# Patient Record
Sex: Male | Born: 1970 | Race: White | Hispanic: No | Marital: Married | Smoking: Current every day smoker
Health system: Southern US, Community
[De-identification: ages and names within clinical notes are randomized; demographics above are authoritative.]

---

## 2012-07-14 ENCOUNTER — Emergency Department
Admission: EM | Admit: 2012-07-14 | Discharge: 2012-07-14 | Disposition: A | Discharge status: Home / Self Care | Payer: BC Managed Care – PPO | Source: Home / Self Care | Attending: Family Medicine | Admitting: Family Medicine

## 2012-07-14 ENCOUNTER — Encounter: Payer: Self-pay | Admitting: *Deleted

## 2012-07-14 ENCOUNTER — Emergency Department (INDEPENDENT_AMBULATORY_CARE_PROVIDER_SITE_OTHER): Payer: BC Managed Care – PPO

## 2012-07-14 DIAGNOSIS — R319 Hematuria, unspecified: Secondary | ICD-10-CM

## 2012-07-14 DIAGNOSIS — R6883 Chills (without fever): Secondary | ICD-10-CM

## 2012-07-14 DIAGNOSIS — R109 Unspecified abdominal pain: Secondary | ICD-10-CM

## 2012-07-14 DIAGNOSIS — R197 Diarrhea, unspecified: Secondary | ICD-10-CM

## 2012-07-14 DIAGNOSIS — R112 Nausea with vomiting, unspecified: Secondary | ICD-10-CM

## 2012-07-14 LAB — POCT CBC W AUTO DIFF (K'VILLE URGENT CARE)

## 2012-07-14 LAB — POCT URINALYSIS DIP (MANUAL ENTRY)
Leukocytes, UA: NEGATIVE
Nitrite, UA: NEGATIVE
Protein Ur, POC: 30
pH, UA: 6.5 (ref 5–8)

## 2012-07-14 MED ORDER — ONDANSETRON HCL 4 MG PO TABS: 4.0000 mg | ORAL_TABLET | Freq: Four times a day (QID) | ORAL | Status: DC

## 2012-07-14 MED ORDER — ONDANSETRON HCL 4 MG/2ML IJ SOLN
4.0000 mg | Freq: Once | INTRAMUSCULAR | Status: AC
  Administered 2012-07-14: 4 mg via INTRAMUSCULAR

## 2012-07-14 MED ORDER — KETOROLAC TROMETHAMINE 60 MG/2ML IM SOLN
60.0000 mg | Freq: Once | INTRAMUSCULAR | Status: AC
  Administered 2012-07-14: 60 mg via INTRAMUSCULAR

## 2012-07-14 MED ORDER — ONDANSETRON 4 MG PO TBDP
4.0000 mg | ORAL_TABLET | Freq: Once | ORAL | Status: AC
  Administered 2012-07-14: 4 mg via ORAL

## 2012-07-14 MED ORDER — IOHEXOL 300 MG/ML  SOLN
100.0000 mL | Freq: Once | INTRAMUSCULAR | Status: AC | PRN
  Administered 2012-07-14: 100 mL via INTRAVENOUS

## 2012-07-14 NOTE — ED Notes (Signed)
Patient c/o abdomen pain, back pain, nausea, diarrhea since last night 11:30p. Patient also c/o painful urination and blood in urine noticed this morning. Has tried Tylenol for the pain with no relief.

## 2012-07-14 NOTE — ED Provider Notes (Signed)
History     CSN: [id number]  Arrival date & time 07/14/12  1136   First MD Initiated Contact with Patient 07/14/12 1250      Chief Complaint  Patient presents with  . Nausea  . Diarrhea  . Dysuria      HPI Comments: The patient complains of awakening at midnight yesterday with mild abdominal pain radiating to his back.  During the night he developed nausea (but no vomiting) and diarrhea.  At 8AM today he noticed mild dysuria and urine darker than normal.  He has had chills/sweats this morning.  No respiratory symptoms.  He states that he has a long history of mild hematuria and has had a work-up for this in the past.   He notes that his wife's two young nieces have been visiting, both of whom have similar GI symptoms.  Denies recent foreign travel, or drinking untreated water in a wilderness environment.   Patient is a 41 y.o. male presenting with abdominal pain. The history is provided by the patient.  Abdominal Pain The primary symptoms of the illness include abdominal pain, fatigue, shortness of breath, nausea, diarrhea, hematemesis and dysuria. The primary symptoms of the illness do not include fever, vomiting or hematochezia. The current episode started 6 to 12 hours ago. The onset of the illness was sudden. The problem has not changed since onset. The hematemesis is not associated with heartburn.  The dysuria is not associated with hematuria, frequency or urgency.  The illness is associated with awakening from sleep. The patient has not had a change in bowel habit. Additional symptoms associated with the illness include chills, anorexia and back pain. Symptoms associated with the illness do not include diaphoresis, heartburn, constipation, urgency, hematuria or frequency. Significant associated medical issues do not include PUD, GERD, inflammatory bowel disease, diabetes, gallstones, liver disease, substance abuse or diverticulitis.    History reviewed. No pertinent past medical  history.  History reviewed. No pertinent past surgical history.  Family History  Problem Relation Age of Onset  . Lupus Father     History  Substance Use Topics  . Smoking status: Former Games developer  . Smokeless tobacco: Never Used  . Alcohol Use: No      Review of Systems  Constitutional: Positive for chills and fatigue. Negative for fever and diaphoresis.  Respiratory: Positive for shortness of breath.   Gastrointestinal: Positive for nausea, abdominal pain, diarrhea, anorexia and hematemesis. Negative for heartburn, vomiting, constipation and hematochezia.  Genitourinary: Positive for dysuria. Negative for urgency, frequency and hematuria.  Musculoskeletal: Positive for back pain.    Allergies  Pollen extract  Home Medications   Current Outpatient Rx  Name  Route  Sig  Dispense  Refill  . ONDANSETRON HCL 4 MG PO TABS   Oral   Take 1 tablet (4 mg total) by mouth every 6 (six) hours. as needed for nausea   12 tablet   0     BP 106/82  Pulse 119  Temp 98.7 F (37.1 C) (Oral)  Resp 24  Ht 5\' 9"  (1.753 m)  Wt 182 lb 8 oz (82.781 kg)  BMI 26.95 kg/m2  SpO2 100%  Physical Exam Nursing notes and Vital Signs reviewed. Appearance:  Patient appears healthy, stated age, and in no acute distress Eyes:  Pupils are equal, round, and reactive to light and accomodation.  Extraocular movement is intact.  Conjunctivae are not inflamed  Ears:  Canals normal.  Tympanic membranes normal.  Nose:   Normal turbinates.  No sinus tenderness.   Mouth/Pharynx:  Normal; moist mucous membranes  Neck:  Supple.  No adenopathy Lungs:  Clear to auscultation.  Breath sounds are equal.  Heart:  Regular rate and rhythm without murmurs, rubs, or gallops.  Abdomen:  There is mild peri-umbilical tenderness without rebound present.  No masses or hepatosplenomegaly.  Bowel sounds are present and increased.  No CVA or flank tenderness.  Extremities:  No edema.  No calf tenderness Skin:  No rash  present.   ED Course  Procedures  none   Labs Reviewed  POCT URINALYSIS DIP (MANUAL ENTRY) small BIL, Trace KET, Small BLO, PRO 30 mg/dL, otherwise negative  POCT CBC W AUTO DIFF ([facility])  WBC 16.7; LY 5.4; MO 5.1; GR 89.5; Hgb 17.0; Platelets 304    Ct Abdomen Pelvis W Contrast  07/14/2012  *RADIOLOGY REPORT*  Clinical Data: A generalized abdominal pain, diarrhea, chills and hematuria.  CT ABDOMEN AND PELVIS WITH CONTRAST  Technique:  Multidetector CT imaging of the abdomen and pelvis was performed following the standard protocol during bolus administration of intravenous contrast.  Contrast: OMNIPAQUE IOHEXOL 300 MG/ML  SOLN  Comparison: None.  Findings: The lung bases are clear.  No pleural effusion or pulmonary nodule.  The heart is normal in size.  No pericardial effusion.  Mild distal esophageal wall thickening could be due to reflux esophagitis.  No mass.  There is diffuse fatty infiltration of the liver but no focal hepatic lesions or intrahepatic biliary dilatation.  The portal and hepatic veins are patent.  The gallbladder is normal.  No common bile duct dilatation.  The pancreas is normal.  The spleen is normal.  The adrenal glands and kidneys are normal.  No renal calculi or hydronephrosis.  No obstructing ureteral calculi.  The bladder appears normal.  The stomach is markedly distended with fluid, contrast and air. The duodenum, small bowel and colon are unremarkable.  No inflammatory changes, mass lesions or obstruction.  The terminal ileum is normal.  The appendix is normal.  The aorta is normal in caliber.  The major branch vessels are patent.  No mesenteric or retroperitoneal mass or lymphadenopathy. A small scattered lymph nodes are noted.  The bladder, prostate gland and seminal vesicles are unremarkable. No pelvic mass, adenopathy or free pelvic fluid collections.  No inguinal mass or hernia.  The bony structures are unremarkable.  IMPRESSION:  1.  No acute  abdominal/pelvic findings, mass lesions or adenopathy. 2.  Fairly marked and diffuse fatty infiltration of the liver. 3.  Distal esophageal wall thickening could be due to reflux esophagitis.  Recommend clinical correlation with any symptoms of dysphagia. 4.  Moderate distention of the stomach.   Original Report Authenticated By: Rudie Meyer, M.D.      1. Nausea & vomiting; despite a leukocytosis of 16.7 suspect viral gastroenteriotis   2. Diarrhea       MDM  Given Zofran 4mg  po. Rx for Zofran. Begin Pedialyte until diarrhea resolved, then switch to regular clear liquids for about 12 to 18 hours.  Advance to a BRAT as tolerated, then gradually advance to a regular diet as tolerated. To decrease diarrhea, mix one heaping tablespoon Citrucel (methylcellulose) in 8 oz water and drink one to three times daily.  When stools become more formed, may take Imodium (loperamide) once or twice daily to decrease stool frequency.  Return for increasing pain, fever, vomiting, etc If symptoms become significantly worse during the night or over the weekend, proceed  to the local emergency room.  Discussed red flags requiring follow-up  Followup with Family Doctor if not improved in about 5 days.  Followup also for finding of fatty liver.        Lattie Haw, MD 07/17/12 [phone]

## 2013-09-24 ENCOUNTER — Encounter: Payer: Self-pay | Admitting: Emergency Medicine

## 2013-09-24 ENCOUNTER — Emergency Department (INDEPENDENT_AMBULATORY_CARE_PROVIDER_SITE_OTHER): Payer: BC Managed Care – PPO

## 2013-09-24 ENCOUNTER — Emergency Department (INDEPENDENT_AMBULATORY_CARE_PROVIDER_SITE_OTHER)
Admission: EM | Admit: 2013-09-24 | Discharge: 2013-09-24 | Disposition: A | Discharge status: Home / Self Care | Payer: BC Managed Care – PPO | Source: Home / Self Care | Attending: Family Medicine | Admitting: Family Medicine

## 2013-09-24 DIAGNOSIS — R030 Elevated blood-pressure reading, without diagnosis of hypertension: Secondary | ICD-10-CM

## 2013-09-24 DIAGNOSIS — M545 Low back pain, unspecified: Secondary | ICD-10-CM

## 2013-09-24 DIAGNOSIS — R195 Other fecal abnormalities: Secondary | ICD-10-CM

## 2013-09-24 LAB — POCT CBC W AUTO DIFF (K'VILLE URGENT CARE)

## 2013-09-24 LAB — POCT URINALYSIS DIP (MANUAL ENTRY)
BILIRUBIN UA: NEGATIVE
Bilirubin, UA: NEGATIVE
Glucose, UA: NEGATIVE
LEUKOCYTES UA: NEGATIVE
Nitrite, UA: NEGATIVE
PH UA: 5.5 (ref 5–8)
PROTEIN UA: NEGATIVE
Spec Grav, UA: 1.025 (ref 1.005–1.03)
UROBILINOGEN UA: 0.2 (ref 0–1)

## 2013-09-24 MED ORDER — CYCLOBENZAPRINE HCL 10 MG PO TABS: ORAL_TABLET | ORAL | Status: DC

## 2013-09-24 NOTE — ED Notes (Signed)
Low back pain x 1 month worse in the last week, also green stools x 1 week

## 2013-09-24 NOTE — Discharge Instructions (Signed)
Begin back exercises.  Monitor Blood pressure and record. Followup with Family Doctor if blood pressure remains elevated.   Back Exercises Back exercises help treat and prevent back injuries. The goal of back exercises is to increase the strength of your abdominal and back muscles and the flexibility of your back. These exercises should be started when you no longer have back pain. Back exercises include:  Pelvic Tilt. Lie on your back with your knees bent. Tilt your pelvis until the lower part of your back is against the floor. Hold this position 5 to 10 sec and repeat 5 to 10 times.  Knee to Chest. Pull first 1 knee up against your chest and hold for 20 to 30 seconds, repeat this with the other knee, and then both knees. This may be done with the other leg straight or bent, whichever feels better.  Sit-Ups or Curl-Ups. Bend your knees 90 degrees. Start with tilting your pelvis, and do a partial, slow sit-up, lifting your trunk only 30 to 45 degrees off the floor. Take at least 2 to 3 seconds for each sit-up. Do not do sit-ups with your knees out straight. If partial sit-ups are difficult, simply do the above but with only tightening your abdominal muscles and holding it as directed.  Hip-Lift. Lie on your back with your knees flexed 90 degrees. Push down with your feet and shoulders as you raise your hips a couple inches off the floor; hold for 10 seconds, repeat 5 to 10 times.  Back arches. Lie on your stomach, propping yourself up on bent elbows. Slowly press on your hands, causing an arch in your low back. Repeat 3 to 5 times. Any initial stiffness and discomfort should lessen with repetition over time.  Shoulder-Lifts. Lie face down with arms beside your body. Keep hips and torso pressed to floor as you slowly lift your head and shoulders off the floor. Do not overdo your exercises, especially in the beginning. Exercises may cause you some mild back discomfort which lasts for a few minutes;  however, if the pain is more severe, or lasts for more than 15 minutes, do not continue exercises until you see your caregiver. Improvement with exercise therapy for back problems is slow.  See your caregivers for assistance with developing a proper back exercise program. Document Released: 08/12/2004 Document Revised: 09/27/2011 Document Reviewed: 05/06/2011 [facility] Patient Information 2014 [place].

## 2013-09-24 NOTE — ED Provider Notes (Signed)
CSN: [id number]     Arrival date & time 09/24/13  1404 History   First MD Initiated Contact with Patient 09/24/13 1425     Chief Complaint  Patient presents with  . Back Pain     HPI Comments: Patient complains of 2 month history of vague bilateral lower back pain which he thinks is in the area of his kidneys.  The pain is worse upon arising each morning, and becomes better during the day.  He denies urinary symptoms.  No fevers, chills, and sweats.  No radicular symptoms.  He recalls no injury to his back.  No urinary symptoms.  He states that he has had a long history of microscopic hematuria with a thorough and negative workup in the past. He also complains of having green stools for about one week.  No diarrhea.  No abdominal pain, nausea/vomiting.  He has had no change in diet, and denies eating excessive amounts of green leafy vegetables or green dye containing food. He reports that his blood pressure is normally about 120/90 at home, and is always elevated when he visits a doctor's office.     Patient is a 43 y.o. male presenting with back pain. The history is provided by the patient.  Back Pain Location:  Lumbar spine Quality:  Burning and aching Radiates to:  Does not radiate Pain severity:  Mild Pain is:  Worse during the night Onset quality:  Gradual Duration:  1 month Timing:  Constant Progression:  Unchanged Chronicity:  New Context: not occupational injury, not recent illness and not recent injury   Relieved by:  Nothing Worsened by:  Lying down Ineffective treatments:  None tried Associated symptoms: no abdominal pain, no abdominal swelling, no bladder incontinence, no bowel incontinence, no chest pain, no dysuria, no fever, no leg pain, no numbness, no paresthesias, no pelvic pain, no perianal numbness, no tingling, no weakness and no weight loss     History reviewed. No pertinent past medical history. History reviewed. No pertinent past surgical history. Family  History  Problem Relation Age of Onset  . Lupus Father   . Hypertension Father    History  Substance Use Topics  . Smoking status: Former Smoker    Quit date: 09/25/2003  . Smokeless tobacco: Never Used  . Alcohol Use: No    Review of Systems  Constitutional: Negative for fever and weight loss.  Cardiovascular: Negative for chest pain.  Gastrointestinal: Negative for abdominal pain and bowel incontinence.  Genitourinary: Negative for bladder incontinence, dysuria and pelvic pain.  Musculoskeletal: Positive for back pain.  Neurological: Negative for tingling, weakness, numbness and paresthesias.  All other systems reviewed and are negative.    Allergies  Pollen extract  Home Medications   Current Outpatient Rx  Name  Route  Sig  Dispense  Refill  . cyclobenzaprine (FLEXERIL) 10 MG tablet      Take one tab by mouth at bedtime for muscle spasm   10 tablet   1    BP 161/121  Pulse 82  Temp(Src) 98 F (36.7 C) (Oral)  Ht 5' 10"  (1.778 m)  Wt 184 lb (83.462 kg)  BMI 26.40 kg/m2  SpO2 100% Physical Exam Nursing notes and Vital Signs reviewed. Appearance:  Patient appears healthy, stated age, and in no acute distress Eyes:  Pupils are equal, round, and reactive to light and accomodation.  Extraocular movement is intact.  Conjunctivae are not inflamed  Ears:  Canals normal.  Tympanic membranes normal.  Nose:  Normal turbinates.  No sinus tenderness.    Pharynx:  Normal Neck:  Supple.  No adenopathy or thyromegaly Lungs:  Clear to auscultation.  Breath sounds are equal.  Heart:  Regular rate and rhythm without murmurs, rubs, or gallops.  Abdomen:  Nontender without masses or hepatosplenomegaly.  Bowel sounds are present.  No CVA or flank tenderness.  Extremities:  No edema.  No calf tenderness Skin:  No rash present. Back:  Full range of motion.  Can heel/toe walk and squat without difficulty.  No tenderness to palpation.  Straight leg raising test is negative.   Sitting knee extension test is negative.  Strength and sensation in the lower extremities is normal.  Patellar and achilles reflexes are normal    ED Course  Procedures  none    Labs Reviewed  POCT URINALYSIS DIP (MANUAL ENTRY) :  BLO moderate; otherwise negative  COMPLETE METABOLIC PANEL WITH GFR  POCT CBC W AUTO DIFF ([facility]):  WBC 8.4; LY 31.6; MO 8.1; GR 60.3; Hgb 15.9; Platelets 267    Imaging Review Dg Lumbar Spine Complete  09/24/2013   CLINICAL DATA:  Low back pain.  EXAM: LUMBAR SPINE - COMPLETE 4+ VIEW  COMPARISON:  CT of the abdomen and pelvis 07/14/2012.  FINDINGS: Five views of the lumbar spine demonstrate no definite acute displaced fracture or compression type fracture. Alignment is anatomic. No defects of the pars interarticularis. No significant degenerative changes are noted.  IMPRESSION: 1. No acute radiographic abnormality of the lumbar spine.   Electronically Signed   By: Vinnie Langton M.D.   On: 09/24/2013 15:14     MDM   1. Low back pain   2. Green stool ?etiology   3. Elevated blood pressure reading without diagnosis of hypertension    Check CMP Begin Flexeril Begin back exercises.  Monitor Blood pressure and record. Followup with Family Doctor if blood pressure remains elevated.    Kandra Nicolas, MD 09/25/13 [phone]

## 2013-09-25 LAB — COMPLETE METABOLIC PANEL WITH GFR
ALBUMIN: 4.9 g/dL (ref 3.5–5.2)
ALT: 57 U/L — ABNORMAL HIGH (ref 0–53)
AST: 26 U/L (ref 0–37)
Alkaline Phosphatase: 60 U/L (ref 39–117)
BUN: 10 mg/dL (ref 6–23)
CALCIUM: 9.7 mg/dL (ref 8.4–10.5)
CHLORIDE: 106 meq/L (ref 96–112)
CO2: 27 meq/L (ref 19–32)
CREATININE: 1.12 mg/dL (ref 0.50–1.35)
GFR, Est Non African American: 82 mL/min
GLUCOSE: 101 mg/dL — AB (ref 70–99)
POTASSIUM: 4.5 meq/L (ref 3.5–5.3)
Sodium: 146 mEq/L — ABNORMAL HIGH (ref 135–145)
Total Bilirubin: 0.6 mg/dL (ref 0.2–1.2)
Total Protein: 7.1 g/dL (ref 6.0–8.3)

## 2013-09-27 ENCOUNTER — Telehealth: Payer: Self-pay | Admitting: Emergency Medicine

## 2013-10-02 ENCOUNTER — Telehealth: Payer: Self-pay | Admitting: *Deleted

## 2013-12-25 ENCOUNTER — Ambulatory Visit: Payer: BC Managed Care – PPO | Admitting: Sports Medicine

## 2013-12-26 ENCOUNTER — Encounter: Payer: Self-pay | Admitting: Sports Medicine

## 2013-12-26 ENCOUNTER — Ambulatory Visit (INDEPENDENT_AMBULATORY_CARE_PROVIDER_SITE_OTHER): Payer: BC Managed Care – PPO | Admitting: Sports Medicine

## 2013-12-26 VITALS — BP 144/101 | HR 80 | Ht 68.5 in | Wt 179.0 lb

## 2013-12-26 DIAGNOSIS — Z299 Encounter for prophylactic measures, unspecified: Secondary | ICD-10-CM | POA: Diagnosis not present

## 2013-12-26 DIAGNOSIS — Z0000 Encounter for general adult medical examination without abnormal findings: Secondary | ICD-10-CM | POA: Insufficient documentation

## 2013-12-26 DIAGNOSIS — L989 Disorder of the skin and subcutaneous tissue, unspecified: Secondary | ICD-10-CM

## 2013-12-26 NOTE — Assessment & Plan Note (Signed)
Checking routine blood work. Tdap given.

## 2013-12-26 NOTE — Assessment & Plan Note (Signed)
Present small subcentimeter lesion on his left anterior knee that is reasonable a basal cell carcinoma. To see him back at my next available 15 minute slot and we will do a excisional biopsy.

## 2013-12-26 NOTE — Progress Notes (Addendum)
  Subjective:    CC: Establish care.   HPI:  Erman is a pleasant 43 year old male, he was seen in urgent care for back pain which has now resolved, and referred to me for further evaluation. He has no complaints, would like to get caught up on preventive measures.  Skin lesion: Localized over his left anterior knee, present for a long time, no pain, mild in appearance, no changes. It is less than one severe across, and he is worried that this may represent a small skin cancer.  Past medical history, Surgical history, Family history not pertinant except as noted below, Social history, Allergies, and medications have been entered into the medical record, reviewed, and no changes needed.   Review of Systems: No headache, visual changes, nausea, vomiting, diarrhea, constipation, dizziness, abdominal pain, skin rash, fevers, chills, night sweats, swollen lymph nodes, weight loss, chest pain, body aches, joint swelling, muscle aches, shortness of breath, mood changes, visual or auditory hallucinations.  Objective:    General: Well Developed, well nourished, and in no acute distress.  Neuro: Alert and oriented x3, extra-ocular muscles intact, sensation grossly intact.  HEENT: Normocephalic, atraumatic, pupils equal round reactive to light, neck supple, no masses, no lymphadenopathy, thyroid nonpalpable.  Skin: Warm and dry, no rashes noted. There is a 0.5 cm circular lesion on his left anterior knee, nontender, appears similar to a basal cell carcinoma. Cardiac: Regular rate and rhythm, no murmurs rubs or gallops.  Respiratory: Clear to auscultation bilaterally. Not using accessory muscles, speaking in full sentences.  Abdominal: Soft, nontender, nondistended, positive bowel sounds, no masses, no organomegaly.  Musculoskeletal: Shoulder, elbow, wrist, hip, knee, ankle stable, and with full range of motion.  Impression and Recommendations:    The patient was counselled, risk factors were  discussed, anticipatory guidance given.

## 2013-12-27 LAB — LIPID PANEL
Cholesterol: 203 mg/dL — ABNORMAL HIGH (ref 0–200)
HDL: 37 mg/dL — ABNORMAL LOW (ref >39)
LDL Cholesterol: 128 mg/dL — ABNORMAL HIGH (ref 0–99)
Total CHOL/HDL Ratio: 5.5 ratio
Triglycerides: 189 mg/dL — ABNORMAL HIGH (ref <150)
VLDL: 38 mg/dL (ref 0–40)

## 2013-12-27 LAB — COMPREHENSIVE METABOLIC PANEL WITH GFR
ALT: 46 U/L (ref 0–53)
AST: 23 U/L (ref 0–37)
Alkaline Phosphatase: 70 U/L (ref 39–117)
BUN: 9 mg/dL (ref 6–23)
Chloride: 104 meq/L (ref 96–112)
Creat: 1.11 mg/dL (ref 0.50–1.35)
Potassium: 4.4 meq/L (ref 3.5–5.3)

## 2013-12-27 LAB — CBC
HCT: 46.7 % (ref 39.0–52.0)
Hemoglobin: 16 g/dL (ref 13.0–17.0)
MCH: 30.6 pg (ref 26.0–34.0)
MCHC: 34.3 g/dL (ref 30.0–36.0)
MCV: 89.3 fL (ref 78.0–100.0)
Platelets: 306 K/uL (ref 150–400)
RBC: 5.23 MIL/uL (ref 4.22–5.81)
RDW: 13.3 % (ref 11.5–15.5)
WBC: 6.5 K/uL (ref 4.0–10.5)

## 2013-12-27 LAB — COMPREHENSIVE METABOLIC PANEL
Albumin: 5.2 g/dL (ref 3.5–5.2)
CO2: 24 mEq/L (ref 19–32)
Calcium: 10.3 mg/dL (ref 8.4–10.5)
Glucose, Bld: 89 mg/dL (ref 70–99)
Sodium: 140 mEq/L (ref 135–145)
Total Bilirubin: 0.9 mg/dL (ref 0.2–1.2)
Total Protein: 7.3 g/dL (ref 6.0–8.3)

## 2013-12-27 LAB — TSH: TSH: 1.815 u[IU]/mL (ref 0.350–4.500)

## 2013-12-27 LAB — HEMOGLOBIN A1C
Hgb A1c MFr Bld: 5.3 % (ref <5.7)
Mean Plasma Glucose: 105 mg/dL (ref <117)

## 2014-01-07 IMAGING — CT CT ABD-PELV W/ CM
2 of 4 series · 16 of 46 positions shown, 18 images · IV contrast (omnipaque)
Comparison: None.

CLINICAL DATA: A generalized abdominal pain, diarrhea, chills and
hematuria.

CT ABDOMEN AND PELVIS WITH CONTRAST
TECHNIQUE: Multidetector CT imaging of the abdomen and pelvis was
performed following the standard protocol during bolus
administration of intravenous contrast.
Contrast: 100mL OMNIPAQUE IOHEXOL 300 MG/ML  SOLN

[Series 2: abd/pelvis with · axial · 0.70mm/px · z∈[-390,+25]mm · 13 of 93 slices shown, 15 images]
[im 6/93  soft-tissue]
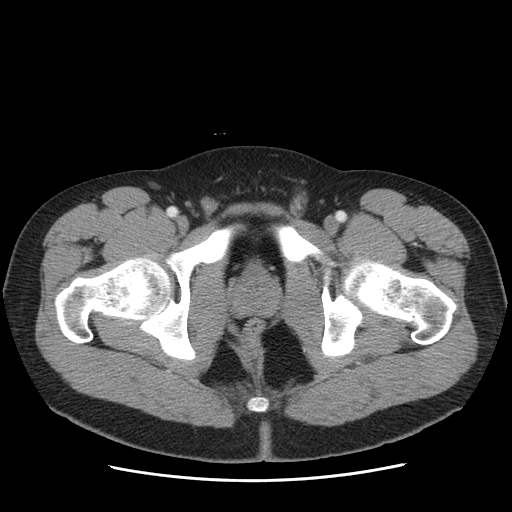
[im 6/93  bone]
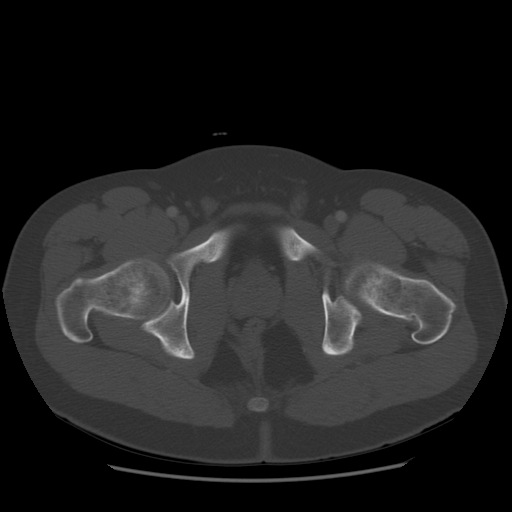
[im 11/93  soft-tissue]
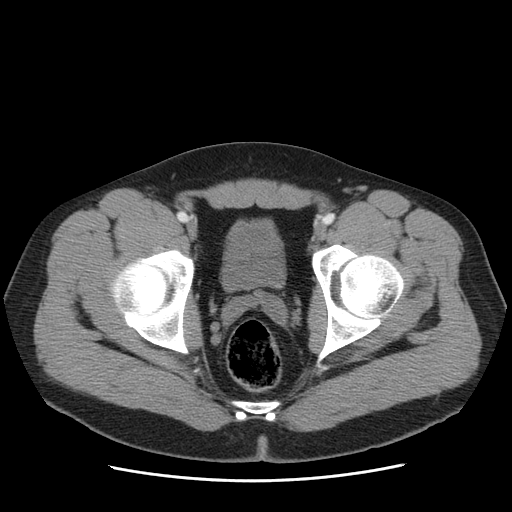
[im 22/93  soft-tissue]
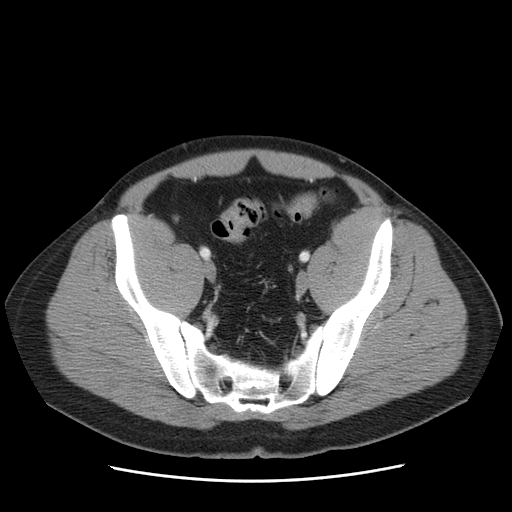
[im 28/93  soft-tissue]
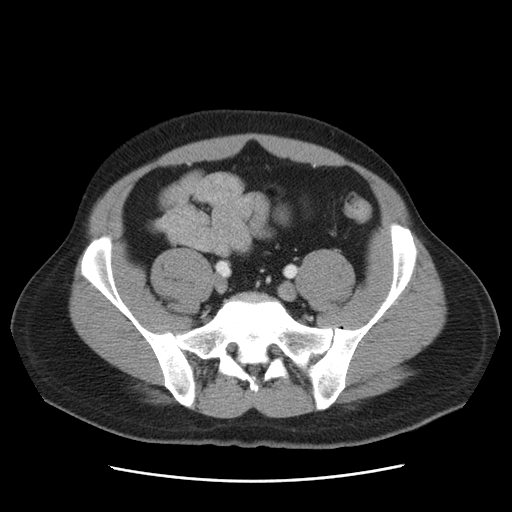
[im 33/93  soft-tissue]
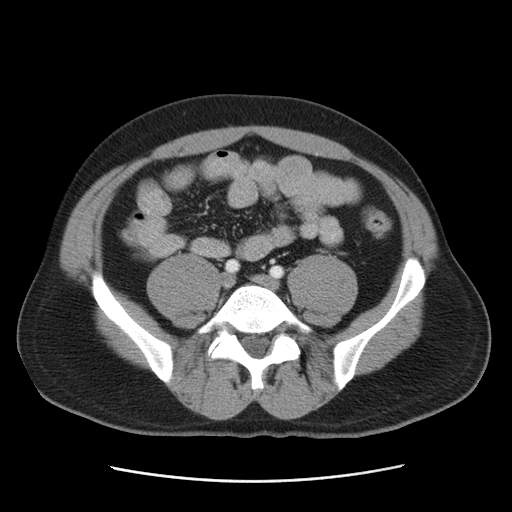
[im 38/93  soft-tissue]
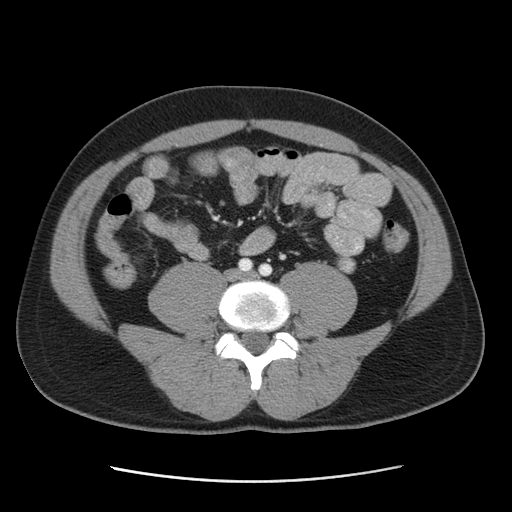
[im 49/93  soft-tissue]
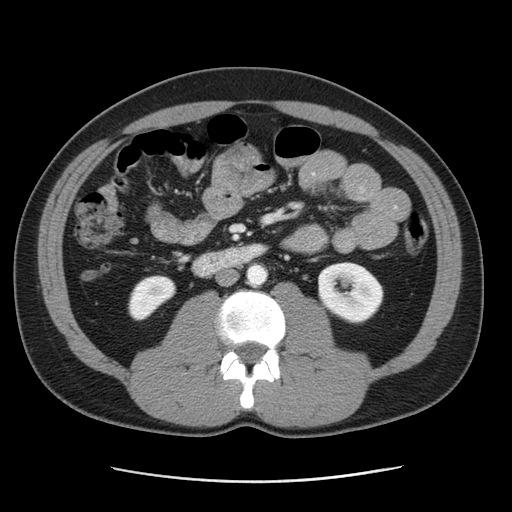
[im 55/93  soft-tissue]
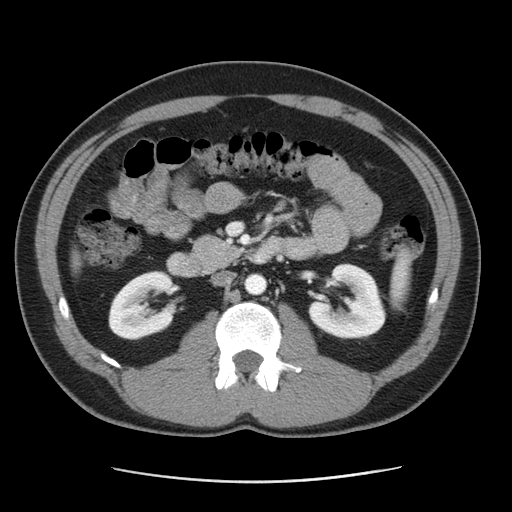
[im 60/93  soft-tissue]
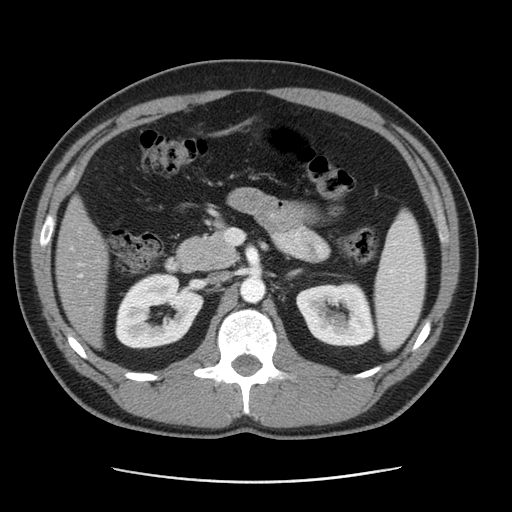
[im 60/93  bone]
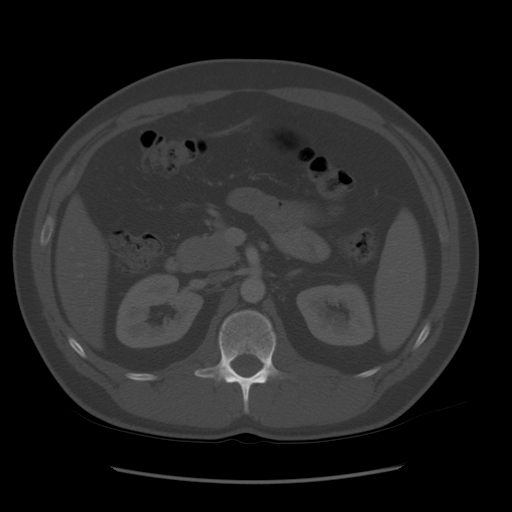
[im 65/93  soft-tissue]
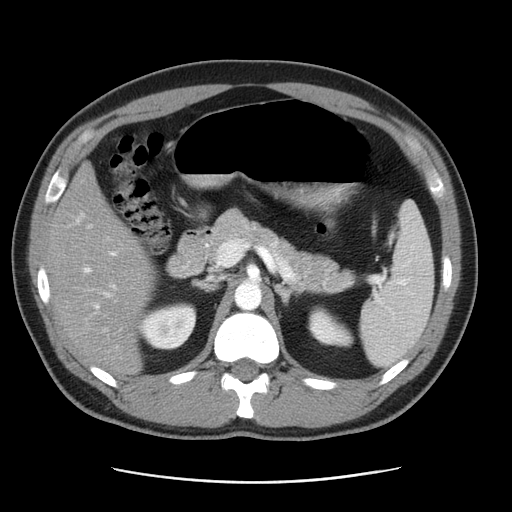
[im 71/93  soft-tissue]
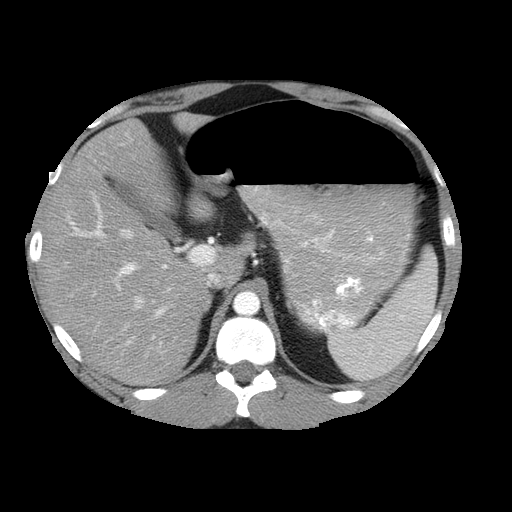
[im 82/93  soft-tissue]
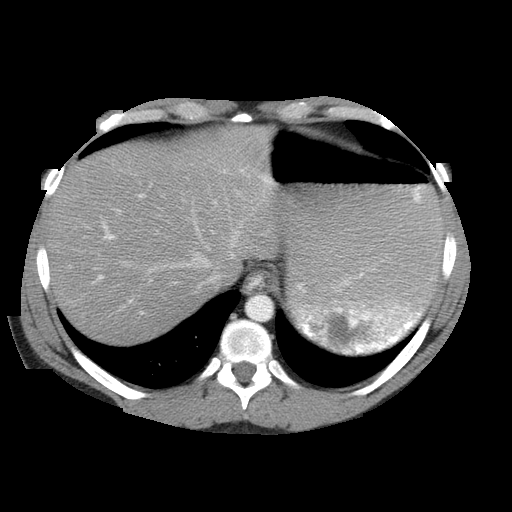
[im 87/93  soft-tissue]
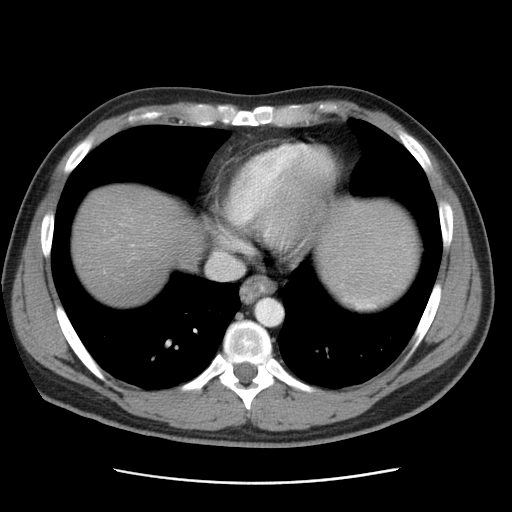

[Series 400: sag · coronal · 0.70mm/px · 3 of 150 slices shown]
[im 50/150  soft-tissue]
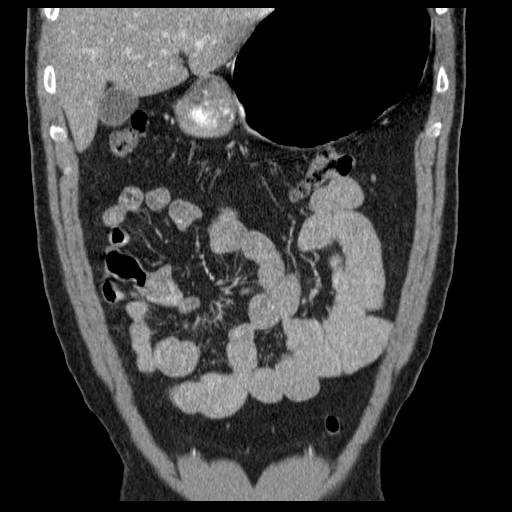
[im 67/150  soft-tissue]
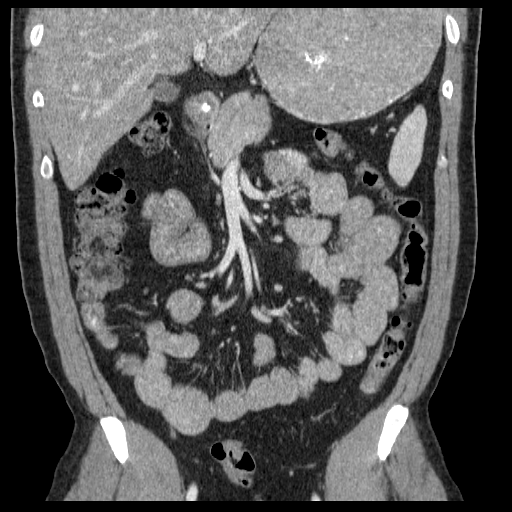
[im 83/150  soft-tissue]
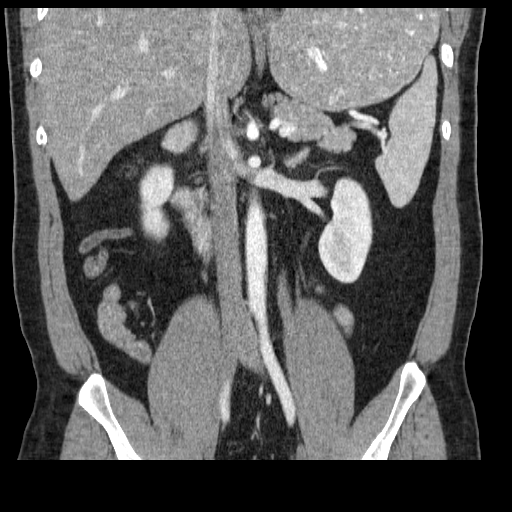

[16 of 46 positions shown; findings below may reference images not displayed]

FINDINGS: The lung bases are clear.  No pleural effusion or
pulmonary nodule.  The heart is normal in size.  No pericardial
effusion.  Mild distal esophageal wall thickening could be due to
reflux esophagitis.  No mass.

There is diffuse fatty infiltration of the liver but no focal
hepatic lesions or intrahepatic biliary dilatation.  The portal and
hepatic veins are patent.  The gallbladder is normal.  No common
bile duct dilatation.  The pancreas is normal.  The spleen is
normal.  The adrenal glands and kidneys are normal.  No renal
calculi or hydronephrosis.  No obstructing ureteral calculi.  The
bladder appears normal.

The stomach is markedly distended with fluid, contrast and air.
The duodenum, small bowel and colon are unremarkable.  No
inflammatory changes, mass lesions or obstruction.  The terminal
ileum is normal.  The appendix is normal.

The aorta is normal in caliber.  The major branch vessels are
patent.  No mesenteric or retroperitoneal mass or lymphadenopathy.
A small scattered lymph nodes are noted.

The bladder, prostate gland and seminal vesicles are unremarkable.
No pelvic mass, adenopathy or free pelvic fluid collections.  No
inguinal mass or hernia.  The bony structures are unremarkable.
IMPRESSION: 1.  No acute abdominal/pelvic findings, mass lesions or adenopathy.
2.  Fairly marked and diffuse fatty infiltration of the liver.
3.  Distal esophageal wall thickening could be due to reflux
esophagitis.  Recommend clinical correlation with any symptoms of
dysphagia.
4.  Moderate distention of the stomach.

## 2014-01-24 ENCOUNTER — Ambulatory Visit: Payer: BC Managed Care – PPO | Admitting: Sports Medicine

## 2014-01-24 ENCOUNTER — Ambulatory Visit (INDEPENDENT_AMBULATORY_CARE_PROVIDER_SITE_OTHER): Payer: BC Managed Care – PPO | Admitting: Sports Medicine

## 2014-01-24 ENCOUNTER — Encounter: Payer: Self-pay | Admitting: Sports Medicine

## 2014-01-24 VITALS — BP 150/101 | HR 92 | Ht 69.0 in | Wt 179.0 lb

## 2014-01-24 DIAGNOSIS — L989 Disorder of the skin and subcutaneous tissue, unspecified: Secondary | ICD-10-CM

## 2014-01-24 NOTE — Progress Notes (Signed)
   Procedure:  Excision of  left knee possibly malignant lesion, appears like a basal cell carcinoma. Risks, benefits, and alternatives explained and consent obtained. Time out conducted. Surface prepped with alcohol. 5cc lidocaine with epinephine infiltrated in a field block. Adequate anesthesia ensured. Area prepped and draped in a sterile fashion. Excision performed with: 4 mm punch biopsy taken, a single 4-0 Prolene suture was placed. Hemostasis achieved. Pt stable.

## 2014-01-24 NOTE — Addendum Note (Signed)
Addended by: Doree Albee on: 01/24/2014 03:48 PM   Modules accepted: Orders

## 2014-01-24 NOTE — Assessment & Plan Note (Signed)
Excision as above. Return in a week for suture removal.

## 2014-02-01 ENCOUNTER — Ambulatory Visit (INDEPENDENT_AMBULATORY_CARE_PROVIDER_SITE_OTHER): Payer: BC Managed Care – PPO | Admitting: Sports Medicine

## 2014-02-01 VITALS — BP 135/98 | HR 67 | Ht 69.0 in

## 2014-02-01 DIAGNOSIS — L989 Disorder of the skin and subcutaneous tissue, unspecified: Secondary | ICD-10-CM

## 2014-02-01 NOTE — Progress Notes (Signed)
   Subjective:    Patient ID: Gary Cummings, male    DOB: [birth year], 43 y.o.   MRN: [id number]  HPI  Patient presents today for suture removal and is 1 wk s/p bunch bx removal. Has no concerns at this time. Margette Fast, CMA  Review of Systems     Objective:   Physical Exam        Assessment & Plan:

## 2014-02-01 NOTE — Assessment & Plan Note (Signed)
Pathology results showed a benign dermatofibroma. Suture was removed, return as needed.

## 2014-12-13 ENCOUNTER — Encounter: Payer: Self-pay | Admitting: Sports Medicine

## 2014-12-13 ENCOUNTER — Ambulatory Visit (INDEPENDENT_AMBULATORY_CARE_PROVIDER_SITE_OTHER): Payer: Self-pay | Admitting: Sports Medicine

## 2014-12-13 VITALS — BP 128/88 | HR 89 | Ht 69.5 in | Wt 177.0 lb

## 2014-12-13 DIAGNOSIS — I10 Essential (primary) hypertension: Secondary | ICD-10-CM | POA: Insufficient documentation

## 2014-12-13 DIAGNOSIS — R030 Elevated blood-pressure reading, without diagnosis of hypertension: Secondary | ICD-10-CM

## 2014-12-13 NOTE — Assessment & Plan Note (Signed)
Blood pressures typically run normal at home when he wakes up and when resting. When active, and here at the [place] office they run approximately 694-854 systolic. Discussed the pathophysiology of hypertension, and that this did not need to be treated. We discussed the importance of a low salt diet. He will keep a blood pressure diary at home for the next 2 weeks, and drop off the paper from my review.

## 2014-12-13 NOTE — Progress Notes (Signed)
  Subjective:    CC: Elevated blood pressure  HPI: Gary Cummings has noted increasing blood pressures for the past several weeks, noted an optometrist office in the 549I systolic, and when he is out and about, it's in the 264 systolic. He has no symptoms, no headache, chest pain, visual changes, no palpitations. In the mornings when he wakes up it is approximately 120/70. Also when sitting for a period of time the blood pressure drops to the normal range. He was concerned, and is here for advice.  Past medical history, Surgical history, Family history not pertinant except as noted below, Social history, Allergies, and medications have been entered into the medical record, reviewed, and no changes needed.   Review of Systems: No fevers, chills, night sweats, weight loss, chest pain, or shortness of breath.   Objective:    General: Well Developed, well nourished, and in no acute distress.  Neuro: Alert and oriented x3, extra-ocular muscles intact, sensation grossly intact.  HEENT: Normocephalic, atraumatic, pupils equal round reactive to light, neck supple, no masses, no lymphadenopathy, thyroid nonpalpable.  Skin: Warm and dry, no rashes. Cardiac: Regular rate and rhythm, no murmurs rubs or gallops, no lower extremity edema.  Respiratory: Clear to auscultation bilaterally. Not using accessory muscles, speaking in full sentences.  Impression and Recommendations:

## 2018-04-04 ENCOUNTER — Encounter: Payer: Self-pay | Admitting: Sports Medicine

## 2018-04-04 ENCOUNTER — Ambulatory Visit (INDEPENDENT_AMBULATORY_CARE_PROVIDER_SITE_OTHER): Payer: Commercial Managed Care - PPO | Admitting: Sports Medicine

## 2018-04-04 DIAGNOSIS — I10 Essential (primary) hypertension: Secondary | ICD-10-CM | POA: Diagnosis not present

## 2018-04-04 DIAGNOSIS — Z0000 Encounter for general adult medical examination without abnormal findings: Secondary | ICD-10-CM

## 2018-04-04 DIAGNOSIS — F418 Other specified anxiety disorders: Secondary | ICD-10-CM

## 2018-04-04 MED ORDER — LISINOPRIL 10 MG PO TABS: 10.0000 mg | ORAL_TABLET | Freq: Every day | ORAL | 3 refills | Status: DC

## 2018-04-04 MED ORDER — SERTRALINE HCL 25 MG PO TABS: 25.0000 mg | ORAL_TABLET | Freq: Every day | ORAL | 2 refills | Status: DC

## 2018-04-04 NOTE — Assessment & Plan Note (Addendum)
Blood pressure very elevated. Adding lisinopril. Return in 2 weeks for a nurse visit blood pressure check.

## 2018-04-04 NOTE — Assessment & Plan Note (Signed)
With some element of generalized anxiety. Adding sertraline 25, he will look up the dose of propranolol and call me and I am happy to send it in. Return in 1 month for PHQ and GAD.

## 2018-04-04 NOTE — Assessment & Plan Note (Addendum)
I have not seen this patient in over 3 years. We will count this is a new patient physical. Checking some routine labs.

## 2018-04-04 NOTE — Progress Notes (Signed)
Subjective:    CC: New Physical  HPI:  This is a pleasant 47 year old male, I have not seen him in 3 years.  Here for a physical, does have some stage fright before presentations and underlying baseline generalized anxiety with mild depressed mood, no suicidal or homicidal ideation.  Did take propranolol with good effect.  Elevated blood pressure: Has been elevated on multiple occasions, no headaches, visual changes, chest pain.  I reviewed the past medical history, family history, social history, surgical history, and allergies today and no changes were needed.  Please see the problem list section below in epic for further details.  Past Medical History: No past medical history on file. Past Surgical History: No past surgical history on file. Social History: Social History   Socioeconomic History  . Marital status: Single    Spouse name: Not on file  . Number of children: Not on file  . Years of education: Not on file  . Highest education level: Not on file  Occupational History  . Not on file  Social Needs  . Financial resource strain: Not on file  . Food insecurity:    Worry: Not on file    Inability: Not on file  . Transportation needs:    Medical: Not on file    Non-medical: Not on file  Tobacco Use  . Smoking status: Former Smoker    Last attempt to quit: 09/25/2003    Years since quitting: 14.5  . Smokeless tobacco: Never Used  Substance and Sexual Activity  . Alcohol use: No  . Drug use: No  . Sexual activity: Not on file  Lifestyle  . Physical activity:    Days per week: Not on file    Minutes per session: Not on file  . Stress: Not on file  Relationships  . Social connections:    Talks on phone: Not on file    Gets together: Not on file    Attends religious service: Not on file    Active member of club or organization: Not on file    Attends meetings of clubs or organizations: Not on file    Relationship status: Not on file  Other Topics Concern  .  Not on file  Social History Narrative  . Not on file   Family History: Family History  Problem Relation Age of Onset  . Lupus Father   . Hypertension Father   . Hypertension Brother    Allergies: Allergies  Allergen Reactions  . Pollen Extract    Medications: See med rec.  Review of Systems: No headache, visual changes, nausea, vomiting, diarrhea, constipation, dizziness, abdominal pain, skin rash, fevers, chills, night sweats, swollen lymph nodes, weight loss, chest pain, body aches, joint swelling, muscle aches, shortness of breath, mood changes, visual or auditory hallucinations.  Objective:    General: Well Developed, well nourished, and in no acute distress.  Neuro: Alert and oriented x3, extra-ocular muscles intact, sensation grossly intact. Cranial nerves II through XII are intact, motor, sensory, and coordinative functions are all intact. HEENT: Normocephalic, atraumatic, pupils equal round reactive to light, neck supple, no masses, no lymphadenopathy, thyroid nonpalpable. Oropharynx, nasopharynx, external ear canals are unremarkable. Skin: Warm and dry, no rashes noted.  Cardiac: Regular rate and rhythm, no murmurs rubs or gallops.  Respiratory: Clear to auscultation bilaterally. Not using accessory muscles, speaking in full sentences.  Abdominal: Soft, nontender, nondistended, positive bowel sounds, no masses, no organomegaly.  Musculoskeletal: Shoulder, elbow, wrist, hip, knee, ankle stable, and with  full range of motion.  Impression and Recommendations:    The patient was counselled, risk factors were discussed, anticipatory guidance given.  Annual physical exam I have not seen this patient in over 3 years. We will count this is a new patient physical. Checking some routine labs.  Benign essential hypertension Blood pressure very elevated. Adding lisinopril. Return in 2 weeks for a nurse visit blood pressure check.  Situational anxiety With some element of  generalized anxiety. Adding sertraline 25, he will look up the dose of propranolol and call me and I am happy to send it in. Return in 1 month for PHQ and GAD. ___________________________________________ Gwen Her. Dianah Field, M.D., ABFM., CAQSM. Primary Care and [place] Instructor of [place] of [facility] of Medicine

## 2018-04-05 MED ORDER — PROPRANOLOL HCL 10 MG PO TABS: 10.0000 mg | ORAL_TABLET | ORAL | 11 refills | Status: DC | PRN

## 2018-04-07 LAB — CBC
HCT: 48.1 % (ref 38.5–50.0)
Hemoglobin: 16.6 g/dL (ref 13.2–17.1)
MCH: 30.5 pg (ref 27.0–33.0)
MCHC: 34.5 g/dL (ref 32.0–36.0)
MCV: 88.4 fL (ref 80.0–100.0)
MPV: 9.9 fL (ref 7.5–12.5)
Platelets: 373 10*3/uL (ref 140–400)
RBC: 5.44 Million/uL (ref 4.20–5.80)
RDW: 12.8 % (ref 11.0–15.0)
WBC: 8 Thousand/uL (ref 3.8–10.8)

## 2018-04-07 LAB — HEMOGLOBIN A1C
Hgb A1c MFr Bld: 5.3 %{Hb} (ref <5.7)
Mean Plasma Glucose: 105 (calc)
eAG (mmol/L): 5.8 (calc)

## 2018-04-07 LAB — COMPREHENSIVE METABOLIC PANEL WITH GFR
AG Ratio: 2.1 (calc) (ref 1.0–2.5)
Albumin: 4.9 g/dL (ref 3.6–5.1)
CO2: 25 mmol/L (ref 20–32)
Globulin: 2.3 g/dL (ref 1.9–3.7)
Glucose, Bld: 110 mg/dL — ABNORMAL HIGH (ref 65–99)
Potassium: 4.4 mmol/L (ref 3.5–5.3)
Sodium: 140 mmol/L (ref 135–146)
Total Protein: 7.2 g/dL (ref 6.1–8.1)

## 2018-04-07 LAB — COMPREHENSIVE METABOLIC PANEL
ALT: 59 U/L — ABNORMAL HIGH (ref 9–46)
AST: 31 U/L (ref 10–40)
Alkaline phosphatase (APISO): 75 U/L (ref 40–115)
BUN: 12 mg/dL (ref 7–25)
Calcium: 9.9 mg/dL (ref 8.6–10.3)
Chloride: 104 mmol/L (ref 98–110)
Creat: 1.31 mg/dL (ref 0.60–1.35)
Total Bilirubin: 1 mg/dL (ref 0.2–1.2)

## 2018-04-07 LAB — LIPID PANEL W/REFLEX DIRECT LDL
Cholesterol: 225 mg/dL — ABNORMAL HIGH (ref <200)
HDL: 38 mg/dL — ABNORMAL LOW (ref >40)
LDL Cholesterol (Calc): 157 mg/dL (calc) — ABNORMAL HIGH
Non-HDL Cholesterol (Calc): 187 mg/dL — ABNORMAL HIGH (ref <130)
Total CHOL/HDL Ratio: 5.9 (calc) — ABNORMAL HIGH (ref <5.0)
Triglycerides: 164 mg/dL — ABNORMAL HIGH (ref <150)

## 2018-04-07 LAB — HIV ANTIBODY (ROUTINE TESTING W REFLEX): HIV 1&2 Ab, 4th Generation: NONREACTIVE

## 2018-04-07 LAB — TSH: TSH: 2.06 m[IU]/L (ref 0.40–4.50)

## 2018-04-18 ENCOUNTER — Ambulatory Visit (INDEPENDENT_AMBULATORY_CARE_PROVIDER_SITE_OTHER): Payer: Commercial Managed Care - PPO | Admitting: Sports Medicine

## 2018-04-18 VITALS — BP 113/81 | HR 72

## 2018-04-18 DIAGNOSIS — I10 Essential (primary) hypertension: Secondary | ICD-10-CM

## 2018-04-18 NOTE — Progress Notes (Signed)
Please see update, I forgot to add his tobacco history. Pt went from former smoker back to current daily smoker.

## 2018-04-18 NOTE — Progress Notes (Addendum)
Pt came into clinic today for BP check. Pt reports he has been taking all 3 medications on file, no complaints. BP today at goal. Will route to Provider for review. Pt advised we would contact him with any recommendations.  Still declined flu shot today. He will look and see if he has had a tdap in the last 10 years.   Vitals:   04/18/18 0830  BP: 113/81  Pulse: 72   Updated smoking history, he is now a current smoker again instead of former.

## 2018-04-18 NOTE — Assessment & Plan Note (Signed)
Well-controlled now, no changes in plan.

## 2018-05-02 ENCOUNTER — Encounter: Payer: Self-pay | Admitting: Sports Medicine

## 2018-05-02 ENCOUNTER — Ambulatory Visit: Payer: Commercial Managed Care - PPO | Admitting: Sports Medicine

## 2018-05-02 VITALS — BP 114/80 | HR 69 | Ht 69.5 in | Wt 178.0 lb

## 2018-05-02 DIAGNOSIS — Z23 Encounter for immunization: Secondary | ICD-10-CM | POA: Diagnosis not present

## 2018-05-02 DIAGNOSIS — Z0000 Encounter for general adult medical examination without abnormal findings: Secondary | ICD-10-CM

## 2018-05-02 DIAGNOSIS — F418 Other specified anxiety disorders: Secondary | ICD-10-CM

## 2018-05-02 MED ORDER — SERTRALINE HCL 50 MG PO TABS: 50.0000 mg | ORAL_TABLET | Freq: Every day | ORAL | 3 refills | Status: DC

## 2018-05-02 NOTE — Progress Notes (Signed)
  Subjective:    CC: Follow-up  HPI: Generalized anxiety: Situational anxiety well controlled with an occasional propranolol before presentations, still has baseline anxiety, we started sertraline 25 at the last visit, has not noted much improvement yet.  No suicidal or homicidal ideation.  Preventive measures: Due for Tdap.  I reviewed the past medical history, family history, social history, surgical history, and allergies today and no changes were needed.  Please see the problem list section below in epic for further details.  Past Medical History: No past medical history on file. Past Surgical History: No past surgical history on file. Social History: Social History   Socioeconomic History  . Marital status: Married    Spouse name: Not on file  . Number of children: Not on file  . Years of education: Not on file  . Highest education level: Not on file  Occupational History  . Not on file  Social Needs  . Financial resource strain: Not on file  . Food insecurity:    Worry: Not on file    Inability: Not on file  . Transportation needs:    Medical: Not on file    Non-medical: Not on file  Tobacco Use  . Smoking status: Current Every Day Smoker    Packs/day: 0.25    Last attempt to quit: 09/25/2003    Years since quitting: 14.6  . Smokeless tobacco: Never Used  Substance and Sexual Activity  . Alcohol use: No  . Drug use: No  . Sexual activity: Not on file  Lifestyle  . Physical activity:    Days per week: Not on file    Minutes per session: Not on file  . Stress: Not on file  Relationships  . Social connections:    Talks on phone: Not on file    Gets together: Not on file    Attends religious service: Not on file    Active member of club or organization: Not on file    Attends meetings of clubs or organizations: Not on file    Relationship status: Not on file  Other Topics Concern  . Not on file  Social History Narrative  . Not on file   Family  History: Family History  Problem Relation Age of Onset  . Lupus Father   . Hypertension Father   . Hypertension Brother    Allergies: Allergies  Allergen Reactions  . Pollen Extract    Medications: See med rec.  Review of Systems: No fevers, chills, night sweats, weight loss, chest pain, or shortness of breath.   Objective:    General: Well Developed, well nourished, and in no acute distress.  Neuro: Alert and oriented x3, extra-ocular muscles intact, sensation grossly intact.  HEENT: Normocephalic, atraumatic, pupils equal round reactive to light, neck supple, no masses, no lymphadenopathy, thyroid nonpalpable.  Skin: Warm and dry, no rashes. Cardiac: Regular rate and rhythm, no murmurs rubs or gallops, no lower extremity edema.  Respiratory: Clear to auscultation bilaterally. Not using accessory muscles, speaking in full sentences.  Impression and Recommendations:    Situational anxiety There is a component of generalized anxiety with stage fright. Propranolol worked extremely well for his stage fright, baseline anxiety still present. Increasing sertraline to 50 mg, return in 1 month for PHQ/GAD.   Annual physical exam Tdap today. ___________________________________________ Gwen Her. Dianah Field, M.D., ABFM., CAQSM. Primary Care and Sports Medicine [facility]  Adjunct Professor of [place] [employer] of Medicine

## 2018-05-02 NOTE — Assessment & Plan Note (Signed)
There is a component of generalized anxiety with stage fright. Propranolol worked extremely well for his stage fright, baseline anxiety still present. Increasing sertraline to 50 mg, return in 1 month for PHQ/GAD.

## 2018-05-02 NOTE — Assessment & Plan Note (Signed)
Tdap today

## 2018-05-30 ENCOUNTER — Ambulatory Visit: Payer: Commercial Managed Care - PPO | Admitting: Sports Medicine

## 2018-05-30 ENCOUNTER — Encounter: Payer: Self-pay | Admitting: Sports Medicine

## 2018-05-30 DIAGNOSIS — F418 Other specified anxiety disorders: Secondary | ICD-10-CM | POA: Diagnosis not present

## 2018-05-30 NOTE — Progress Notes (Signed)
  Subjective:    CC: Follow-up  HPI: Anxiety: With a generalized as well as a stage fright component, sertraline at the new dose of 50 mg has a limited all generalized anxiety, propranolol used before presentations has eliminated all stage fright, he is happy with how things are going.  I reviewed the past medical history, family history, social history, surgical history, and allergies today and no changes were needed.  Please see the problem list section below in epic for further details.  Past Medical History: No past medical history on file. Past Surgical History: No past surgical history on file. Social History: Social History   Socioeconomic History  . Marital status: Married    Spouse name: Not on file  . Number of children: Not on file  . Years of education: Not on file  . Highest education level: Not on file  Occupational History  . Not on file  Social Needs  . Financial resource strain: Not on file  . Food insecurity:    Worry: Not on file    Inability: Not on file  . Transportation needs:    Medical: Not on file    Non-medical: Not on file  Tobacco Use  . Smoking status: Current Every Day Smoker    Packs/day: 0.25    Last attempt to quit: 09/25/2003    Years since quitting: 14.6  . Smokeless tobacco: Never Used  Substance and Sexual Activity  . Alcohol use: No  . Drug use: No  . Sexual activity: Not on file  Lifestyle  . Physical activity:    Days per week: Not on file    Minutes per session: Not on file  . Stress: Not on file  Relationships  . Social connections:    Talks on phone: Not on file    Gets together: Not on file    Attends religious service: Not on file    Active member of club or organization: Not on file    Attends meetings of clubs or organizations: Not on file    Relationship status: Not on file  Other Topics Concern  . Not on file  Social History Narrative  . Not on file   Family History: Family History  Problem Relation Age of  Onset  . Lupus Father   . Hypertension Father   . Hypertension Brother    Allergies: Allergies  Allergen Reactions  . Pollen Extract    Medications: See med rec.  Review of Systems: No fevers, chills, night sweats, weight loss, chest pain, or shortness of breath.   Objective:    General: Well Developed, well nourished, and in no acute distress.  Neuro: Alert and oriented x3, extra-ocular muscles intact, sensation grossly intact.  HEENT: Normocephalic, atraumatic, pupils equal round reactive to light, neck supple, no masses, no lymphadenopathy, thyroid nonpalpable.  Skin: Warm and dry, no rashes. Cardiac: Regular rate and rhythm, no murmurs rubs or gallops, no lower extremity edema.  Respiratory: Clear to auscultation bilaterally. Not using accessory muscles, speaking in full sentences.  Impression and Recommendations:    Situational anxiety Generalized anxiety is now under excellent control with 50 of sertraline. Stage fright is now under good control with occasional propranolol. No intervention needed from here, return in a year. ___________________________________________ Gwen Her. Dianah Field, M.D., ABFM., CAQSM. Primary Care and Sports Medicine [facility]  Adjunct Professor of [place] [employer] [facility] of Medicine

## 2018-05-30 NOTE — Assessment & Plan Note (Signed)
Generalized anxiety is now under excellent control with 50 of sertraline. Stage fright is now under good control with occasional propranolol. No intervention needed from here, return in a year.

## 2018-07-09 ENCOUNTER — Other Ambulatory Visit: Payer: Self-pay | Admitting: Sports Medicine

## 2018-07-24 ENCOUNTER — Other Ambulatory Visit: Payer: Self-pay | Admitting: Sports Medicine

## 2018-07-24 DIAGNOSIS — I10 Essential (primary) hypertension: Secondary | ICD-10-CM

## 2018-10-12 ENCOUNTER — Other Ambulatory Visit: Payer: Self-pay | Admitting: Sports Medicine

## 2018-10-12 DIAGNOSIS — F418 Other specified anxiety disorders: Secondary | ICD-10-CM

## 2018-12-19 ENCOUNTER — Other Ambulatory Visit: Payer: Self-pay | Admitting: Sports Medicine

## 2018-12-19 DIAGNOSIS — I10 Essential (primary) hypertension: Secondary | ICD-10-CM

## 2019-02-01 ENCOUNTER — Encounter: Payer: Self-pay | Admitting: Sports Medicine

## 2019-02-01 DIAGNOSIS — F418 Other specified anxiety disorders: Secondary | ICD-10-CM

## 2019-02-02 MED ORDER — PROPRANOLOL HCL 10 MG PO TABS: 10.0000 mg | ORAL_TABLET | ORAL | 11 refills | Status: DC | PRN

## 2019-02-28 ENCOUNTER — Other Ambulatory Visit: Payer: Self-pay | Admitting: Sports Medicine

## 2019-02-28 DIAGNOSIS — F418 Other specified anxiety disorders: Secondary | ICD-10-CM

## 2019-05-31 ENCOUNTER — Encounter: Payer: Self-pay | Admitting: Sports Medicine

## 2019-05-31 ENCOUNTER — Ambulatory Visit (INDEPENDENT_AMBULATORY_CARE_PROVIDER_SITE_OTHER): Payer: Commercial Managed Care - PPO | Admitting: Sports Medicine

## 2019-05-31 ENCOUNTER — Other Ambulatory Visit: Payer: Self-pay

## 2019-05-31 DIAGNOSIS — I10 Essential (primary) hypertension: Secondary | ICD-10-CM | POA: Diagnosis not present

## 2019-05-31 DIAGNOSIS — F418 Other specified anxiety disorders: Secondary | ICD-10-CM | POA: Diagnosis not present

## 2019-05-31 DIAGNOSIS — E782 Mixed hyperlipidemia: Secondary | ICD-10-CM | POA: Insufficient documentation

## 2019-05-31 DIAGNOSIS — L918 Other hypertrophic disorders of the skin: Secondary | ICD-10-CM | POA: Diagnosis not present

## 2019-05-31 DIAGNOSIS — Z0000 Encounter for general adult medical examination without abnormal findings: Secondary | ICD-10-CM | POA: Diagnosis not present

## 2019-05-31 NOTE — Assessment & Plan Note (Signed)
Well-controlled with sertraline and propanolol.

## 2019-05-31 NOTE — Assessment & Plan Note (Signed)
Annual physical as above, checking routine labs.

## 2019-05-31 NOTE — Assessment & Plan Note (Signed)
Rechecking lipids today.

## 2019-05-31 NOTE — Assessment & Plan Note (Signed)
Cryotherapy of approximately 6 skin tags on the left upper arm and neck.

## 2019-05-31 NOTE — Assessment & Plan Note (Signed)
Well controlled 

## 2019-05-31 NOTE — Progress Notes (Signed)
Subjective:    CC: Annual physical  HPI:  Gary Cummings is here for his physical, he has a few skin tags.  I reviewed the past medical history, family history, social history, surgical history, and allergies today and no changes were needed.  Please see the problem list section below in epic for further details.  Past Medical History: No past medical history on file. Past Surgical History: No past surgical history on file. Social History: Social History   Socioeconomic History  . Marital status: Married    Spouse name: Not on file  . Number of children: Not on file  . Years of education: Not on file  . Highest education level: Not on file  Occupational History  . Not on file  Social Needs  . Financial resource strain: Not on file  . Food insecurity    Worry: Not on file    Inability: Not on file  . Transportation needs    Medical: Not on file    Non-medical: Not on file  Tobacco Use  . Smoking status: Current Every Day Smoker    Packs/day: 0.25    Last attempt to quit: 09/25/2003    Years since quitting: 15.6  . Smokeless tobacco: Never Used  Substance and Sexual Activity  . Alcohol use: No  . Drug use: No  . Sexual activity: Not on file  Lifestyle  . Physical activity    Days per week: Not on file    Minutes per session: Not on file  . Stress: Not on file  Relationships  . Social Herbalist on phone: Not on file    Gets together: Not on file    Attends religious service: Not on file    Active member of club or organization: Not on file    Attends meetings of clubs or organizations: Not on file    Relationship status: Not on file  Other Topics Concern  . Not on file  Social History Narrative  . Not on file   Family History: Family History  Problem Relation Age of Onset  . Lupus Father   . Hypertension Father   . Hypertension Brother    Allergies: Allergies  Allergen Reactions  . Pollen Extract    Medications: See med rec.  Review of Systems:  No headache, visual changes, nausea, vomiting, diarrhea, constipation, dizziness, abdominal pain, skin rash, fevers, chills, night sweats, swollen lymph nodes, weight loss, chest pain, body aches, joint swelling, muscle aches, shortness of breath, mood changes, visual or auditory hallucinations.  Objective:    General: Well Developed, well nourished, and in no acute distress.  Neuro: Alert and oriented x3, extra-ocular muscles intact, sensation grossly intact. Cranial nerves II through XII are intact, motor, sensory, and coordinative functions are all intact. HEENT: Normocephalic, atraumatic, pupils equal round reactive to light, neck supple, no masses, no lymphadenopathy, thyroid nonpalpable. Oropharynx, nasopharynx, external ear canals are unremarkable. Skin: Warm and dry, no rashes noted.  Cardiac: Regular rate and rhythm, no murmurs rubs or gallops.  Respiratory: Clear to auscultation bilaterally. Not using accessory muscles, speaking in full sentences.  Abdominal: Soft, nontender, nondistended, positive bowel sounds, no masses, no organomegaly.  Musculoskeletal: Shoulder, elbow, wrist, hip, knee, ankle stable, and with full range of motion.  Procedure:  Cryodestruction of approximately 6 skin tags on the left upper inner arm and neck Consent obtained and verified. Time-out conducted. Noted no overlying erythema, induration, or other signs of local infection. Completed without difficulty using Cryo-Gun. Advised  to call if fevers/chills, erythema, induration, drainage, or persistent bleeding.  Impression and Recommendations:    The patient was counselled, risk factors were discussed, anticipatory guidance given.  Annual physical exam Annual physical as above, checking routine labs.  Benign essential hypertension Well-controlled.  Situational anxiety Well-controlled with sertraline and propanolol.  Skin tag Cryotherapy of approximately 6 skin tags on the left upper arm and neck.   Mixed hyperlipidemia Rechecking lipids today.   ___________________________________________ Gwen Her. Dianah Field, M.D., ABFM., CAQSM. Primary Care and Sports Medicine [facility] [facility]  Adjunct Professor of [place] of [facility] of Medicine

## 2019-06-01 ENCOUNTER — Other Ambulatory Visit: Payer: Self-pay | Admitting: Sports Medicine

## 2019-06-01 DIAGNOSIS — I10 Essential (primary) hypertension: Secondary | ICD-10-CM

## 2019-06-01 LAB — LIPID PANEL W/REFLEX DIRECT LDL
Cholesterol: 243 mg/dL — ABNORMAL HIGH (ref <200)
HDL: 35 mg/dL — ABNORMAL LOW (ref >=40)
LDL Cholesterol (Calc): 174 mg/dL (calc) — ABNORMAL HIGH
Non-HDL Cholesterol (Calc): 208 mg/dL (calc) — ABNORMAL HIGH (ref <130)
Total CHOL/HDL Ratio: 6.9 (calc) — ABNORMAL HIGH (ref <5.0)
Triglycerides: 184 mg/dL — ABNORMAL HIGH (ref <150)

## 2019-06-01 LAB — COMPLETE METABOLIC PANEL WITH GFR
AG Ratio: 2 (calc) (ref 1.0–2.5)
ALT: 53 U/L — ABNORMAL HIGH (ref 9–46)
AST: 27 U/L (ref 10–40)
Albumin: 4.8 g/dL (ref 3.6–5.1)
Alkaline phosphatase (APISO): 62 U/L (ref 36–130)
BUN: 19 mg/dL (ref 7–25)
CO2: 25 mmol/L (ref 20–32)
Calcium: 10 mg/dL (ref 8.6–10.3)
Chloride: 104 mmol/L (ref 98–110)
Creat: 1.34 mg/dL (ref 0.60–1.35)
GFR, Est African American: 73 mL/min/{1.73_m2} (ref >=60)
GFR, Est Non African American: 63 mL/min/{1.73_m2} (ref >=60)
Globulin: 2.4 g/dL (calc) (ref 1.9–3.7)
Glucose, Bld: 101 mg/dL — ABNORMAL HIGH (ref 65–99)
Potassium: 4.9 mmol/L (ref 3.5–5.3)
Sodium: 140 mmol/L (ref 135–146)
Total Bilirubin: 0.7 mg/dL (ref 0.2–1.2)
Total Protein: 7.2 g/dL (ref 6.1–8.1)

## 2019-06-01 LAB — CBC
HCT: 46.8 % (ref 38.5–50.0)
Hemoglobin: 15.8 g/dL (ref 13.2–17.1)
MCH: 30.2 pg (ref 27.0–33.0)
MCHC: 33.8 g/dL (ref 32.0–36.0)
MCV: 89.5 fL (ref 80.0–100.0)
MPV: 9.6 fL (ref 7.5–12.5)
Platelets: 331 10*3/uL (ref 140–400)
RBC: 5.23 10*6/uL (ref 4.20–5.80)
RDW: 12.7 % (ref 11.0–15.0)
WBC: 7.4 10*3/uL (ref 3.8–10.8)

## 2019-06-01 LAB — TSH: TSH: 2.1 mIU/L (ref 0.40–4.50)

## 2019-06-01 LAB — VITAMIN D 25 HYDROXY (VIT D DEFICIENCY, FRACTURES): Vit D, 25-Hydroxy: 17 ng/mL — ABNORMAL LOW (ref 30–100)

## 2019-06-01 MED ORDER — VITAMIN D (ERGOCALCIFEROL) 1.25 MG (50000 UNIT) PO CAPS: 50000.0000 [IU] | ORAL_CAPSULE | ORAL | 0 refills | Status: DC

## 2019-06-01 NOTE — Addendum Note (Signed)
Addended by: Silverio Decamp on: 06/01/2019 09:32 AM   Modules accepted: Orders

## 2019-06-13 ENCOUNTER — Encounter: Payer: Self-pay | Admitting: Sports Medicine

## 2019-06-13 ENCOUNTER — Ambulatory Visit (INDEPENDENT_AMBULATORY_CARE_PROVIDER_SITE_OTHER): Payer: Commercial Managed Care - PPO | Admitting: Sports Medicine

## 2019-06-13 ENCOUNTER — Other Ambulatory Visit: Payer: Self-pay

## 2019-06-13 DIAGNOSIS — E782 Mixed hyperlipidemia: Secondary | ICD-10-CM

## 2019-06-13 DIAGNOSIS — L918 Other hypertrophic disorders of the skin: Secondary | ICD-10-CM

## 2019-06-13 MED ORDER — ATORVASTATIN CALCIUM 10 MG PO TABS: 10.0000 mg | ORAL_TABLET | Freq: Every day | ORAL | 3 refills | Status: DC

## 2019-06-13 NOTE — Assessment & Plan Note (Signed)
Cryotherapy of additional skin tags including a hemangioma on the anterior chest. Return as needed for this.

## 2019-06-13 NOTE — Progress Notes (Signed)
Subjective:    CC: Follow-up  HPI: Gary Cummings returns, his skin tags are doing well, he has a few more that he would like treated.  Hyperlipidemia: Agreeable to start statin medication.  I reviewed the past medical history, family history, social history, surgical history, and allergies today and no changes were needed.  Please see the problem list section below in epic for further details.  Past Medical History: No past medical history on file. Past Surgical History: No past surgical history on file. Social History: Social History   Socioeconomic History  . Marital status: Married    Spouse name: Not on file  . Number of children: Not on file  . Years of education: Not on file  . Highest education level: Not on file  Occupational History  . Not on file  Social Needs  . Financial resource strain: Not on file  . Food insecurity    Worry: Not on file    Inability: Not on file  . Transportation needs    Medical: Not on file    Non-medical: Not on file  Tobacco Use  . Smoking status: Current Every Day Smoker    Packs/day: 0.25    Last attempt to quit: 09/25/2003    Years since quitting: 15.7  . Smokeless tobacco: Never Used  Substance and Sexual Activity  . Alcohol use: No  . Drug use: No  . Sexual activity: Not on file  Lifestyle  . Physical activity    Days per week: Not on file    Minutes per session: Not on file  . Stress: Not on file  Relationships  . Social Herbalist on phone: Not on file    Gets together: Not on file    Attends religious service: Not on file    Active member of club or organization: Not on file    Attends meetings of clubs or organizations: Not on file    Relationship status: Not on file  Other Topics Concern  . Not on file  Social History Narrative  . Not on file   Family History: Family History  Problem Relation Age of Onset  . Lupus Father   . Hypertension Father   . Hypertension Brother    Allergies: Allergies   Allergen Reactions  . Pollen Extract    Medications: See med rec.  Review of Systems: No fevers, chills, night sweats, weight loss, chest pain, or shortness of breath.   Objective:    General: Well Developed, well nourished, and in no acute distress.  Neuro: Alert and oriented x3, extra-ocular muscles intact, sensation grossly intact.  HEENT: Normocephalic, atraumatic, pupils equal round reactive to light, neck supple, no masses, no lymphadenopathy, thyroid nonpalpable.  Skin: Warm and dry, no rashes.  Sites of previous skin tag cryotherapy are doing extremely well. Cardiac: Regular rate and rhythm, no murmurs rubs or gallops, no lower extremity edema.  Respiratory: Clear to auscultation bilaterally. Not using accessory muscles, speaking in full sentences.  Procedure:  Cryodestruction of #5 skin tags, one on the right axilla, 2 in the left axilla, 2 on the chest. Consent obtained and verified. Time-out conducted. Noted no overlying erythema, induration, or other signs of local infection. Completed without difficulty using Cryo-Gun. Advised to call if fevers/chills, erythema, induration, drainage, or persistent bleeding.  Impression and Recommendations:    Skin tag Cryotherapy of additional skin tags including a hemangioma on the anterior chest. Return as needed for this.  Mixed hyperlipidemia Atorvastatin 10, recheck lipids  in 3 months.   ___________________________________________ Gwen Her. Dianah Field, M.D., ABFM., CAQSM. Primary Care and Sports Medicine [place] [facility]  Adjunct Professor of [place] of [facility] of Medicine

## 2019-06-13 NOTE — Assessment & Plan Note (Signed)
Atorvastatin 10, recheck lipids in 3 months.

## 2019-06-21 ENCOUNTER — Other Ambulatory Visit: Payer: Self-pay | Admitting: Sports Medicine

## 2019-06-21 DIAGNOSIS — F418 Other specified anxiety disorders: Secondary | ICD-10-CM

## 2019-09-11 ENCOUNTER — Ambulatory Visit (INDEPENDENT_AMBULATORY_CARE_PROVIDER_SITE_OTHER): Payer: Commercial Managed Care - PPO | Admitting: Sports Medicine

## 2019-09-11 ENCOUNTER — Encounter: Payer: Self-pay | Admitting: Sports Medicine

## 2019-09-11 ENCOUNTER — Other Ambulatory Visit: Payer: Self-pay

## 2019-09-11 DIAGNOSIS — E782 Mixed hyperlipidemia: Secondary | ICD-10-CM

## 2019-09-11 NOTE — Assessment & Plan Note (Signed)
Gary Cummings is been on atorvastatin now for about 3 months, no adverse effects. Rechecking lipids today, he was really just supposed to go to the lab so I am going to no charge this visit.

## 2019-09-11 NOTE — Progress Notes (Signed)
    Procedures performed today:    None.  Independent interpretation of tests performed by another provider:   None.  Impression and Recommendations:    Mixed hyperlipidemia Ronalee Belts is been on atorvastatin now for about 3 months, no adverse effects. Rechecking lipids today, he was really just supposed to go to the lab so I am going to no charge this visit.    ___________________________________________ Gwen Her. Dianah Field, M.D., ABFM., CAQSM. Primary Care and [place] Instructor of [place] of [facility] of Medicine

## 2019-09-12 LAB — LIPID PANEL W/REFLEX DIRECT LDL
Cholesterol: 134 mg/dL (ref <200)
HDL: 35 mg/dL — ABNORMAL LOW (ref >=40)
LDL Cholesterol (Calc): 78 mg/dL (calc)
Non-HDL Cholesterol (Calc): 99 mg/dL (calc) (ref <130)
Total CHOL/HDL Ratio: 3.8 (calc) (ref <5.0)
Triglycerides: 126 mg/dL (ref <150)

## 2019-10-01 ENCOUNTER — Other Ambulatory Visit: Payer: Self-pay | Admitting: Sports Medicine

## 2019-10-01 DIAGNOSIS — F418 Other specified anxiety disorders: Secondary | ICD-10-CM

## 2019-10-01 DIAGNOSIS — I10 Essential (primary) hypertension: Secondary | ICD-10-CM

## 2019-10-11 ENCOUNTER — Ambulatory Visit: Payer: Commercial Managed Care - PPO

## 2020-01-16 ENCOUNTER — Other Ambulatory Visit: Payer: Self-pay | Admitting: Sports Medicine

## 2020-01-16 DIAGNOSIS — F418 Other specified anxiety disorders: Secondary | ICD-10-CM

## 2020-02-20 ENCOUNTER — Other Ambulatory Visit: Payer: Self-pay | Admitting: Sports Medicine

## 2020-02-20 DIAGNOSIS — I10 Essential (primary) hypertension: Secondary | ICD-10-CM

## 2020-04-14 ENCOUNTER — Encounter: Payer: Commercial Managed Care - PPO | Admitting: Sports Medicine

## 2020-04-21 ENCOUNTER — Encounter: Payer: Commercial Managed Care - PPO | Admitting: Sports Medicine

## 2020-05-05 ENCOUNTER — Ambulatory Visit (INDEPENDENT_AMBULATORY_CARE_PROVIDER_SITE_OTHER): Payer: Commercial Managed Care - PPO | Admitting: Sports Medicine

## 2020-05-05 ENCOUNTER — Encounter: Payer: Self-pay | Admitting: Sports Medicine

## 2020-05-05 VITALS — BP 119/80 | HR 71 | Ht 69.5 in | Wt 197.0 lb

## 2020-05-05 DIAGNOSIS — Z1211 Encounter for screening for malignant neoplasm of colon: Secondary | ICD-10-CM

## 2020-05-05 DIAGNOSIS — Z0000 Encounter for general adult medical examination without abnormal findings: Secondary | ICD-10-CM | POA: Diagnosis not present

## 2020-05-05 DIAGNOSIS — I10 Essential (primary) hypertension: Secondary | ICD-10-CM | POA: Diagnosis not present

## 2020-05-05 DIAGNOSIS — E559 Vitamin D deficiency, unspecified: Secondary | ICD-10-CM | POA: Diagnosis not present

## 2020-05-05 DIAGNOSIS — E782 Mixed hyperlipidemia: Secondary | ICD-10-CM

## 2020-05-05 DIAGNOSIS — F418 Other specified anxiety disorders: Secondary | ICD-10-CM

## 2020-05-05 MED ORDER — LISINOPRIL 10 MG PO TABS: 10.0000 mg | ORAL_TABLET | Freq: Every day | ORAL | 3 refills | Status: DC

## 2020-05-05 MED ORDER — PROPRANOLOL HCL 10 MG PO TABS: 10.0000 mg | ORAL_TABLET | ORAL | 11 refills | Status: AC | PRN

## 2020-05-05 MED ORDER — ATORVASTATIN CALCIUM 10 MG PO TABS: 10.0000 mg | ORAL_TABLET | Freq: Every day | ORAL | 3 refills | Status: DC

## 2020-05-05 NOTE — Progress Notes (Signed)
Subjective:    CC: Annual Physical Exam  HPI:  This patient is here for their annual physical  I reviewed the past medical history, family history, social history, surgical history, and allergies today and no changes were needed.  Please see the problem list section below in epic for further details.  Past Medical History: No past medical history on file. Past Surgical History: No past surgical history on file. Social History: Social History   Socioeconomic History  . Marital status: Married    Spouse name: Not on file  . Number of children: Not on file  . Years of education: Not on file  . Highest education level: Not on file  Occupational History  . Not on file  Tobacco Use  . Smoking status: Current Every Day Smoker    Packs/day: 0.25    Last attempt to quit: 09/25/2003    Years since quitting: 16.6  . Smokeless tobacco: Never Used  Substance and Sexual Activity  . Alcohol use: No  . Drug use: No  . Sexual activity: Not on file  Other Topics Concern  . Not on file  Social History Narrative  . Not on file   Social Determinants of Health   Financial Resource Strain:   . Difficulty of Paying Living Expenses: Not on file  Food Insecurity:   . Worried About Charity fundraiser in the Last Year: Not on file  . Ran Out of Food in the Last Year: Not on file  Transportation Needs:   . Lack of Transportation (Medical): Not on file  . Lack of Transportation (Non-Medical): Not on file  Physical Activity:   . Days of Exercise per Week: Not on file  . Minutes of Exercise per Session: Not on file  Stress:   . Feeling of Stress : Not on file  Social Connections:   . Frequency of Communication with Friends and Family: Not on file  . Frequency of Social Gatherings with Friends and Family: Not on file  . Attends Religious Services: Not on file  . Active Member of Clubs or Organizations: Not on file  . Attends Archivist Meetings: Not on file  . Marital Status:  Not on file   Family History: Family History  Problem Relation Age of Onset  . Lupus Father   . Hypertension Father   . Hypertension Brother    Allergies: Allergies  Allergen Reactions  . Pollen Extract    Medications: See med rec.  Review of Systems: No headache, visual changes, nausea, vomiting, diarrhea, constipation, dizziness, abdominal pain, skin rash, fevers, chills, night sweats, swollen lymph nodes, weight loss, chest pain, body aches, joint swelling, muscle aches, shortness of breath, mood changes, visual or auditory hallucinations.  Objective:    General: Well Developed, well nourished, and in no acute distress.  Neuro: Alert and oriented x3, extra-ocular muscles intact, sensation grossly intact. Cranial nerves II through XII are intact, motor, sensory, and coordinative functions are all intact. HEENT: Normocephalic, atraumatic, pupils equal round reactive to light, neck supple, no masses, no lymphadenopathy, thyroid nonpalpable. Oropharynx, nasopharynx, external ear canals are unremarkable. Skin: Warm and dry, no rashes noted.  Cardiac: Regular rate and rhythm, no murmurs rubs or gallops.  Respiratory: Clear to auscultation bilaterally. Not using accessory muscles, speaking in full sentences.  Abdominal: Soft, nontender, nondistended, positive bowel sounds, no masses, no organomegaly.  Musculoskeletal: Shoulder, elbow, wrist, hip, knee, ankle stable, and with full range of motion.  Impression and Recommendations:  The patient was counselled, risk factors were discussed, anticipatory guidance given.  Annual physical exam Annual physical as above, checking routine labs. Adding Cologuard, declines flu shot.  Situational anxiety Off of sertraline, continue with propranolol as needed for presentations, feels okay. No need to continue.   ___________________________________________ Gwen Her. Dianah Field, M.D., ABFM., CAQSM. Primary Care and Sports Medicine [facility]  Adjunct Professor of [place] [employer] [facility] of Medicine

## 2020-05-05 NOTE — Assessment & Plan Note (Signed)
Annual physical as above, checking routine labs. Adding Cologuard, declines flu shot.

## 2020-05-05 NOTE — Assessment & Plan Note (Signed)
Off of sertraline, continue with propranolol as needed for presentations, feels okay. No need to continue.

## 2020-05-06 LAB — COMPREHENSIVE METABOLIC PANEL
AG Ratio: 2.2 (calc) (ref 1.0–2.5)
ALT: 48 U/L — ABNORMAL HIGH (ref 9–46)
AST: 22 U/L (ref 10–40)
Albumin: 4.7 g/dL (ref 3.6–5.1)
Alkaline phosphatase (APISO): 61 U/L (ref 36–130)
BUN: 18 mg/dL (ref 7–25)
CO2: 27 mmol/L (ref 20–32)
Calcium: 10.1 mg/dL (ref 8.6–10.3)
Chloride: 106 mmol/L (ref 98–110)
Creat: 1.15 mg/dL (ref 0.60–1.35)
Globulin: 2.1 g/dL (calc) (ref 1.9–3.7)
Glucose, Bld: 103 mg/dL — ABNORMAL HIGH (ref 65–99)
Potassium: 5 mmol/L (ref 3.5–5.3)
Sodium: 141 mmol/L (ref 135–146)
Total Bilirubin: 0.7 mg/dL (ref 0.2–1.2)
Total Protein: 6.8 g/dL (ref 6.1–8.1)

## 2020-05-06 LAB — CBC
HCT: 45.8 % (ref 38.5–50.0)
Hemoglobin: 15.6 g/dL (ref 13.2–17.1)
MCH: 31.5 pg (ref 27.0–33.0)
MCHC: 34.1 g/dL (ref 32.0–36.0)
MCV: 92.3 fL (ref 80.0–100.0)
MPV: 9.7 fL (ref 7.5–12.5)
Platelets: 290 10*3/uL (ref 140–400)
RBC: 4.96 10*6/uL (ref 4.20–5.80)
RDW: 12.4 % (ref 11.0–15.0)
WBC: 6.9 10*3/uL (ref 3.8–10.8)

## 2020-05-06 LAB — TSH: TSH: 2.18 mIU/L (ref 0.40–4.50)

## 2020-05-06 LAB — HEPATITIS C ANTIBODY
Hepatitis C Ab: NONREACTIVE
SIGNAL TO CUT-OFF: 0.01 (ref <1.00)

## 2020-05-06 LAB — VITAMIN D 25 HYDROXY (VIT D DEFICIENCY, FRACTURES): Vit D, 25-Hydroxy: 38 ng/mL (ref 30–100)

## 2020-05-06 LAB — LDL CHOLESTEROL, DIRECT: Direct LDL: 76 mg/dL (ref <100)

## 2020-08-20 DIAGNOSIS — I10 Essential (primary) hypertension: Secondary | ICD-10-CM

## 2020-08-20 MED ORDER — LISINOPRIL 10 MG PO TABS
10.0000 mg | ORAL_TABLET | Freq: Every day | ORAL | 3 refills | Status: DC
Start: 2020-08-20 — End: 2021-09-28

## 2020-11-07 LAB — COLOGUARD
COLOGUARD: NEGATIVE
Cologuard: NEGATIVE

## 2020-11-10 ENCOUNTER — Encounter: Payer: Self-pay | Admitting: Sports Medicine

## 2020-11-10 NOTE — Progress Notes (Signed)
Cologuard results received via fax from [employer]. Results abstracted into chart, patient aware of results, and results sent for scanning into chart.

## 2021-05-05 ENCOUNTER — Ambulatory Visit (INDEPENDENT_AMBULATORY_CARE_PROVIDER_SITE_OTHER): Payer: Commercial Managed Care - PPO | Admitting: Sports Medicine

## 2021-05-05 ENCOUNTER — Other Ambulatory Visit: Payer: Self-pay

## 2021-05-05 VITALS — BP 131/83 | HR 57 | Ht 69.5 in | Wt 196.1 lb

## 2021-05-05 DIAGNOSIS — N139 Obstructive and reflux uropathy, unspecified: Secondary | ICD-10-CM | POA: Diagnosis not present

## 2021-05-05 DIAGNOSIS — Z0000 Encounter for general adult medical examination without abnormal findings: Secondary | ICD-10-CM

## 2021-05-05 DIAGNOSIS — E782 Mixed hyperlipidemia: Secondary | ICD-10-CM

## 2021-05-05 NOTE — Assessment & Plan Note (Signed)
Annual physical as above, checking routine labs.

## 2021-05-05 NOTE — Progress Notes (Signed)
  Subjective:    CC: Annual Physical Exam  HPI:  This patient is here for their annual physical  I reviewed the past medical history, family history, social history, surgical history, and allergies today and no changes were needed.  Please see the problem list section below in epic for further details.  Past Medical History: No past medical history on file. Past Surgical History: No past surgical history on file. Social History: Social History   Socioeconomic History   Marital status: Married    Spouse name: Not on file   Number of children: Not on file   Years of education: Not on file   Highest education level: Not on file  Occupational History   Not on file  Tobacco Use   Smoking status: Every Day    Packs/day: 0.25    Types: Cigarettes    Last attempt to quit: 09/25/2003    Years since quitting: 17.6   Smokeless tobacco: Never  Substance and Sexual Activity   Alcohol use: No   Drug use: No   Sexual activity: Not on file  Other Topics Concern   Not on file  Social History Narrative   Not on file   Social Determinants of Health   Financial Resource Strain: Not on file  Food Insecurity: Not on file  Transportation Needs: Not on file  Physical Activity: Not on file  Stress: Not on file  Social Connections: Not on file   Family History: Family History  Problem Relation Age of Onset   Lupus Father    Hypertension Father    Hypertension Brother    Allergies: Allergies  Allergen Reactions   Pollen Extract    Medications: See med rec.  Review of Systems: No headache, visual changes, nausea, vomiting, diarrhea, constipation, dizziness, abdominal pain, skin rash, fevers, chills, night sweats, swollen lymph nodes, weight loss, chest pain, body aches, joint swelling, muscle aches, shortness of breath, mood changes, visual or auditory hallucinations.  Objective:    General: Well Developed, well nourished, and in no acute distress.  Neuro: Alert and oriented x3,  extra-ocular muscles intact, sensation grossly intact. Cranial nerves II through XII are intact, motor, sensory, and coordinative functions are all intact. HEENT: Normocephalic, atraumatic, pupils equal round reactive to light, neck supple, no masses, no lymphadenopathy, thyroid nonpalpable. Oropharynx, nasopharynx, external ear canals are unremarkable. Skin: Warm and dry, no rashes noted.  Few scattered seborrheic keratoses. Cardiac: Regular rate and rhythm, no murmurs rubs or gallops.  Respiratory: Clear to auscultation bilaterally. Not using accessory muscles, speaking in full sentences.  Abdominal: Soft, nontender, nondistended, positive bowel sounds, no masses, no organomegaly.  Musculoskeletal: Shoulder, elbow, wrist, hip, knee, ankle stable, and with full range of motion.  Impression and Recommendations:    The patient was counselled, risk factors were discussed, anticipatory guidance given.  Annual physical exam Annual physical as above, checking routine labs.   ___________________________________________ Gwen Her. Dianah Field, M.D., ABFM., CAQSM. Primary Care and Sports Medicine [place] [facility]  Adjunct Professor of [place] [employer] [facility] of Medicine

## 2021-05-06 LAB — LIPID PANEL
Cholesterol: 131 mg/dL (ref <200)
HDL: 33 mg/dL — ABNORMAL LOW (ref >=40)
LDL Cholesterol (Calc): 76 mg/dL (calc)
Non-HDL Cholesterol (Calc): 98 mg/dL (calc) (ref <130)
Total CHOL/HDL Ratio: 4 (calc) (ref <5.0)
Triglycerides: 134 mg/dL (ref <150)

## 2021-05-06 LAB — COMPREHENSIVE METABOLIC PANEL
AG Ratio: 2.1 (calc) (ref 1.0–2.5)
ALT: 47 U/L — ABNORMAL HIGH (ref 9–46)
AST: 23 U/L (ref 10–40)
Albumin: 4.6 g/dL (ref 3.6–5.1)
Alkaline phosphatase (APISO): 78 U/L (ref 36–130)
BUN: 14 mg/dL (ref 7–25)
CO2: 25 mmol/L (ref 20–32)
Calcium: 9.8 mg/dL (ref 8.6–10.3)
Chloride: 105 mmol/L (ref 98–110)
Creat: 1.11 mg/dL (ref 0.60–1.29)
Globulin: 2.2 g/dL (calc) (ref 1.9–3.7)
Glucose, Bld: 106 mg/dL — ABNORMAL HIGH (ref 65–99)
Potassium: 4.4 mmol/L (ref 3.5–5.3)
Sodium: 140 mmol/L (ref 135–146)
Total Bilirubin: 0.6 mg/dL (ref 0.2–1.2)
Total Protein: 6.8 g/dL (ref 6.1–8.1)

## 2021-05-06 LAB — CBC
HCT: 45 % (ref 38.5–50.0)
Hemoglobin: 14.9 g/dL (ref 13.2–17.1)
MCH: 30.5 pg (ref 27.0–33.0)
MCHC: 33.1 g/dL (ref 32.0–36.0)
MCV: 92.2 fL (ref 80.0–100.0)
MPV: 9.7 fL (ref 7.5–12.5)
Platelets: 309 10*3/uL (ref 140–400)
RBC: 4.88 10*6/uL (ref 4.20–5.80)
RDW: 12.5 % (ref 11.0–15.0)
WBC: 6.9 10*3/uL (ref 3.8–10.8)

## 2021-05-06 LAB — PSA, TOTAL AND FREE
PSA, % Free: 60 % (calc) (ref >25)
PSA, Free: 0.3 ng/mL
PSA, Total: 0.5 ng/mL (ref <=4.0)

## 2021-05-06 LAB — TSH: TSH: 1.82 mIU/L (ref 0.40–4.50)

## 2021-05-30 ENCOUNTER — Other Ambulatory Visit: Payer: Self-pay | Admitting: Sports Medicine

## 2021-05-30 DIAGNOSIS — E782 Mixed hyperlipidemia: Secondary | ICD-10-CM

## 2021-08-25 ENCOUNTER — Encounter: Payer: Self-pay | Admitting: Sports Medicine

## 2021-08-25 DIAGNOSIS — N529 Male erectile dysfunction, unspecified: Secondary | ICD-10-CM

## 2021-08-28 DIAGNOSIS — N529 Male erectile dysfunction, unspecified: Secondary | ICD-10-CM | POA: Insufficient documentation

## 2021-08-28 MED ORDER — TADALAFIL 5 MG PO TABS
5.0000 mg | ORAL_TABLET | Freq: Every day | ORAL | 11 refills | Status: AC
Start: 2021-08-28 — End: ?

## 2021-08-28 NOTE — Assessment & Plan Note (Signed)
Adding low-dose daily Cialis.

## 2021-09-28 ENCOUNTER — Other Ambulatory Visit: Payer: Self-pay | Admitting: Sports Medicine

## 2021-09-28 DIAGNOSIS — I10 Essential (primary) hypertension: Secondary | ICD-10-CM

## 2022-05-07 ENCOUNTER — Ambulatory Visit (INDEPENDENT_AMBULATORY_CARE_PROVIDER_SITE_OTHER): Payer: PRIVATE HEALTH INSURANCE | Admitting: Sports Medicine

## 2022-05-07 VITALS — BP 123/81 | HR 71 | Ht 70.0 in | Wt 192.0 lb

## 2022-05-07 DIAGNOSIS — Z0000 Encounter for general adult medical examination without abnormal findings: Secondary | ICD-10-CM

## 2022-05-07 DIAGNOSIS — I10 Essential (primary) hypertension: Secondary | ICD-10-CM

## 2022-05-07 NOTE — Progress Notes (Signed)
  Subjective:    CC: Annual Physical Exam  HPI:  This patient is here for their annual physical  I reviewed the past medical history, family history, social history, surgical history, and allergies today and no changes were needed.  Please see the problem list section below in epic for further details.  Past Medical History: No past medical history on file. Past Surgical History: No past surgical history on file. Social History: Social History   Socioeconomic History   Marital status: Married    Spouse name: Not on file   Number of children: Not on file   Years of education: Not on file   Highest education level: Not on file  Occupational History   Not on file  Tobacco Use   Smoking status: Every Day    Packs/day: 0.25    Types: Cigarettes    Last attempt to quit: 09/25/2003    Years since quitting: 18.6   Smokeless tobacco: Never  Substance and Sexual Activity   Alcohol use: No   Drug use: No   Sexual activity: Not on file  Other Topics Concern   Not on file  Social History Narrative   Not on file   Social Determinants of Health   Financial Resource Strain: Not on file  Food Insecurity: Not on file  Transportation Needs: Not on file  Physical Activity: Not on file  Stress: Not on file  Social Connections: Not on file   Family History: Family History  Problem Relation Age of Onset   Lupus Father    Hypertension Father    Hypertension Brother    Allergies: Allergies  Allergen Reactions   Pollen Extract    Medications: See med rec.  Review of Systems: No headache, visual changes, nausea, vomiting, diarrhea, constipation, dizziness, abdominal pain, skin rash, fevers, chills, night sweats, swollen lymph nodes, weight loss, chest pain, body aches, joint swelling, muscle aches, shortness of breath, mood changes, visual or auditory hallucinations.  Objective:    General: Well Developed, well nourished, and in no acute distress.  Neuro: Alert and oriented x3,  extra-ocular muscles intact, sensation grossly intact. Cranial nerves II through XII are intact, motor, sensory, and coordinative functions are all intact. HEENT: Normocephalic, atraumatic, pupils equal round reactive to light, neck supple, no masses, no lymphadenopathy, thyroid nonpalpable. Oropharynx, nasopharynx, external ear canals are unremarkable minimal nonocclusive cerumen in both canals. Skin: Warm and dry, no rashes noted.  Cardiac: Regular rate and rhythm, no murmurs rubs or gallops.  Respiratory: Clear to auscultation bilaterally. Not using accessory muscles, speaking in full sentences.  Abdominal: Soft, nontender, nondistended, positive bowel sounds, no masses, no organomegaly.  Musculoskeletal: Shoulder, elbow, wrist, hip, knee, ankle stable, and with full range of motion.  Impression and Recommendations:    The patient was counselled, risk factors were discussed, anticipatory guidance given.  Annual physical exam Annual physical as above, declines flu shot. We did give him an exercise prescription today with a goal heart rate of 150-160, 3-5 times a week. Check PSA last year, we will not check it again until he is in his 66s. Otherwise up-to-date on screenings, return to see me in 1 year for fasting physical.  ____________________________________________ Gwen Her. Dianah Field, M.D., ABFM., CAQSM., AME. Primary Care and Sports Medicine [place] [facility]  Adjunct Professor of [place] [employer] [facility] of Medicine  Risk manager

## 2022-05-07 NOTE — Assessment & Plan Note (Signed)
Annual physical as above, declines flu shot. We did give him an exercise prescription today with a goal heart rate of 150-160, 3-5 times a week. Check PSA last year, we will not check it again until he is in his 71s. Otherwise up-to-date on screenings, return to see me in 1 year for fasting physical.

## 2022-05-07 NOTE — Patient Instructions (Signed)
Exercise prescription is a goal heart rate of 150 to 160 bpm, keep it there for 30 minutes 3-5 times a week.

## 2022-05-08 LAB — LIPID PANEL
Cholesterol: 127 mg/dL (ref <200)
HDL: 34 mg/dL — ABNORMAL LOW (ref >=40)
LDL Cholesterol (Calc): 74 mg/dL (calc)
Non-HDL Cholesterol (Calc): 93 mg/dL (calc) (ref <130)
Total CHOL/HDL Ratio: 3.7 (calc) (ref <5.0)
Triglycerides: 111 mg/dL (ref <150)

## 2022-05-08 LAB — COMPLETE METABOLIC PANEL WITH GFR
AG Ratio: 2.1 (calc) (ref 1.0–2.5)
ALT: 42 U/L (ref 9–46)
AST: 22 U/L (ref 10–40)
Albumin: 4.7 g/dL (ref 3.6–5.1)
Alkaline phosphatase (APISO): 74 U/L (ref 36–130)
BUN: 15 mg/dL (ref 7–25)
CO2: 25 mmol/L (ref 20–32)
Calcium: 9.7 mg/dL (ref 8.6–10.3)
Chloride: 104 mmol/L (ref 98–110)
Creat: 1.22 mg/dL (ref 0.60–1.29)
Globulin: 2.2 g/dL (calc) (ref 1.9–3.7)
Glucose, Bld: 99 mg/dL (ref 65–99)
Potassium: 4.4 mmol/L (ref 3.5–5.3)
Sodium: 138 mmol/L (ref 135–146)
Total Bilirubin: 0.7 mg/dL (ref 0.2–1.2)
Total Protein: 6.9 g/dL (ref 6.1–8.1)
eGFR: 73 mL/min/{1.73_m2} (ref >=60)

## 2022-05-08 LAB — CBC
HCT: 45.8 % (ref 38.5–50.0)
Hemoglobin: 15.7 g/dL (ref 13.2–17.1)
MCH: 31.4 pg (ref 27.0–33.0)
MCHC: 34.3 g/dL (ref 32.0–36.0)
MCV: 91.6 fL (ref 80.0–100.0)
MPV: 9.8 fL (ref 7.5–12.5)
Platelets: 302 10*3/uL (ref 140–400)
RBC: 5 10*6/uL (ref 4.20–5.80)
RDW: 12.5 % (ref 11.0–15.0)
WBC: 6.9 10*3/uL (ref 3.8–10.8)

## 2022-05-08 LAB — HEMOGLOBIN A1C
Hgb A1c MFr Bld: 5.7 % of total Hgb — ABNORMAL HIGH (ref <5.7)
Mean Plasma Glucose: 117 mg/dL
eAG (mmol/L): 6.5 mmol/L

## 2022-05-08 LAB — TSH: TSH: 2.33 mIU/L (ref 0.40–4.50)

## 2022-08-09 ENCOUNTER — Other Ambulatory Visit: Payer: Self-pay | Admitting: Sports Medicine

## 2022-08-09 DIAGNOSIS — E782 Mixed hyperlipidemia: Secondary | ICD-10-CM

## 2022-08-09 DIAGNOSIS — I10 Essential (primary) hypertension: Secondary | ICD-10-CM

## 2022-12-09 ENCOUNTER — Ambulatory Visit
Admission: RE | Admit: 2022-12-09 | Discharge: 2022-12-09 | Disposition: A | Discharge status: Home / Self Care | Payer: No Typology Code available for payment source | Source: Ambulatory Visit | Attending: Family Medicine | Admitting: Family Medicine

## 2022-12-09 ENCOUNTER — Telehealth: Payer: Self-pay | Admitting: Family Medicine

## 2022-12-09 VITALS — BP 128/90 | HR 83 | Temp 98.2°F | Resp 17

## 2022-12-09 DIAGNOSIS — R059 Cough, unspecified: Secondary | ICD-10-CM

## 2022-12-09 DIAGNOSIS — Z20822 Contact with and (suspected) exposure to covid-19: Secondary | ICD-10-CM

## 2022-12-09 DIAGNOSIS — U071 COVID-19: Secondary | ICD-10-CM

## 2022-12-09 LAB — POC SARS CORONAVIRUS 2 AG -  ED: SARS Coronavirus 2 Ag: POSITIVE — AB

## 2022-12-09 MED ORDER — PROMETHAZINE-DM 6.25-15 MG/5ML PO SYRP: 5.0000 mL | ORAL_SOLUTION | Freq: Two times a day (BID) | ORAL | 0 refills | Status: DC | PRN

## 2022-12-09 MED ORDER — PREDNISONE 20 MG PO TABS: ORAL_TABLET | ORAL | 0 refills | Status: DC

## 2022-12-09 MED ORDER — BENZONATATE 200 MG PO CAPS: 200.0000 mg | ORAL_CAPSULE | Freq: Three times a day (TID) | ORAL | 0 refills | Status: AC | PRN

## 2022-12-09 MED ORDER — PAXLOVID (300/100) 20 X 150 MG & 10 X 100MG PO TBPK: 3.0000 | ORAL_TABLET | Freq: Two times a day (BID) | ORAL | 0 refills | Status: AC

## 2022-12-09 NOTE — ED Provider Notes (Signed)
[facility]    CSN: [id number] Arrival date & time: 12/09/22  0908      History   Chief Complaint Chief Complaint  Patient presents with   Nasal Congestion    Sinus pressure, headache    HPI [name] is a 52 y.o. male.   HPI pleasant 52 year old male presents with cough, nasal congestion, sinus pressure/facial pain and headache for 2 days.  Patient reports recent COVID exposure at work.  Patient is a current everyday cigarette smoker.  PMH significant for HTN and mixed HLD.  History reviewed. No pertinent past medical history.  Patient Active Problem List   Diagnosis Date Noted   Erectile dysfunction 08/28/2021   Mixed hyperlipidemia 05/31/2019   Situational anxiety 04/04/2018   Benign essential hypertension 12/13/2014   Annual physical exam 12/26/2013    History reviewed. No pertinent surgical history.     Home Medications    Prior to Admission medications   Medication Sig Start Date End Date Taking? Authorizing Provider  benzonatate (TESSALON) 200 MG capsule Take 1 capsule (200 mg total) by mouth 3 (three) times daily as needed for up to 7 days. 12/09/22 12/16/22 Yes Trevor Iha, FNP  nirmatrelvir & ritonavir (PAXLOVID, 300/100,) 20 x 150 MG & 10 x 100MG  TBPK Take 3 tablets by mouth 2 (two) times daily for 5 days. 12/09/22 12/14/22 Yes Trevor Iha, FNP  predniSONE (DELTASONE) 20 MG tablet Take 3 tabs PO daily x 5 days. 12/09/22  Yes Trevor Iha, FNP  promethazine-dextromethorphan (PROMETHAZINE-DM) 6.25-15 MG/5ML syrup Take 5 mLs by mouth 2 (two) times daily as needed for cough. 12/09/22  Yes Trevor Iha, FNP  atorvastatin (LIPITOR) 10 MG tablet Take 1 tablet (10 mg total) by mouth daily. 08/09/22   Monica Becton, MD  lisinopril (ZESTRIL) 10 MG tablet Take 1 tablet (10 mg total) by mouth daily. 08/09/22   Monica Becton, MD  propranolol (INDERAL) 10 MG tablet Take 1 tablet (10 mg total) by mouth as needed (before  presentations.). 05/05/20   Monica Becton, MD  tadalafil (CIALIS) 5 MG tablet Take 1 tablet (5 mg total) by mouth daily. 08/28/21   Monica Becton, MD    Family History Family History  Problem Relation Age of Onset   Lupus Father    Hypertension Father    Hypertension Brother     Social History Social History   Tobacco Use   Smoking status: Every Day    Packs/day: .25    Types: Cigarettes    Last attempt to quit: 09/25/2003    Years since quitting: 19.2   Smokeless tobacco: Never  Substance Use Topics   Alcohol use: No   Drug use: No     Allergies   Pollen extract   Review of Systems Review of Systems   Physical Exam Triage Vital Signs ED Triage Vitals  Enc Vitals Group     BP 12/09/22 0915 (!) 128/90     Pulse Rate 12/09/22 0915 83     Resp 12/09/22 0915 17     Temp 12/09/22 0915 98.2 F (36.8 C)     Temp Source 12/09/22 0915 Oral     SpO2 12/09/22 0915 98 %     Weight --      Height --      Head Circumference --      Peak Flow --      Pain Score 12/09/22 0916 3     Pain Loc --  Pain Edu? --      Excl. in GC? --    No data found.  Updated Vital Signs BP (!) 128/90 (BP Location: Right Arm)   Pulse 83   Temp 98.2 F (36.8 C) (Oral)   Resp 17   SpO2 98%   Visual Acuity Right Eye Distance:   Left Eye Distance:   Bilateral Distance:    Right Eye Near:   Left Eye Near:    Bilateral Near:     Physical Exam Vitals and nursing note reviewed.  Constitutional:      Appearance: Normal appearance. He is obese.  HENT:     Head: Normocephalic and atraumatic.     Right Ear: External ear normal.     Left Ear: External ear normal.     Mouth/Throat:     Mouth: Mucous membranes are moist.     Pharynx: Oropharynx is clear.  Eyes:     Extraocular Movements: Extraocular movements intact.     Conjunctiva/sclera: Conjunctivae normal.     Pupils: Pupils are equal, round, and reactive to light.  Cardiovascular:     Rate and Rhythm:  Normal rate and regular rhythm.     Pulses: Normal pulses.     Heart sounds: Normal heart sounds.  Pulmonary:     Effort: Pulmonary effort is normal.     Breath sounds: Normal breath sounds. No wheezing, rhonchi or rales.  Musculoskeletal:        General: Normal range of motion.     Cervical back: Normal range of motion and neck supple.  Skin:    General: Skin is warm and dry.  Neurological:     General: No focal deficit present.     Mental Status: He is alert and oriented to person, place, and time. Mental status is at baseline.  Psychiatric:        Mood and Affect: Mood normal.        Behavior: Behavior normal.        Thought Content: Thought content normal.      UC Treatments / Results  Labs (all labs ordered are listed, but only abnormal results are displayed) Labs Reviewed  POC SARS CORONAVIRUS 2 AG -  ED - Abnormal; Notable for the following components:      Result Value   SARS Coronavirus 2 Ag Positive (*)    All other components within normal limits    EKG   Radiology No results found.  Procedures Procedures (including critical care time)  Medications Ordered in UC Medications - No data to display  Initial Impression / Assessment and Plan / UC Course  I have reviewed the triage vital signs and the nursing notes.  Pertinent labs & imaging results that were available during my care of the patient were reviewed by me and considered in my medical decision making (see chart for details).     MDM: 1.  COVID-19-Rx'd Paxlovid 300/100 20 x 150 mg &`0 x 100 mg TBPK; 2.  Cough-Rx'd Tessalon Perles 200 mg 3 times daily, as needed, promethazine 6.25-15 mg / 5 mL: Take 5 mL twice daily, as needed for cough; 3.  Exposure to COVID-19 virus-COVID-19 positive on POCT patient advised to quarantine for the next 5 days.  5-day work excuse note provided to patient prior to discharge. Instructed patient to take medications as directed with food to completion.  Advised patient to  take prednisone with first dose of Paxlovid for the next 5 days.  Advised may use Tessalon  daily or as needed for cough.  Advised may use Promethazine DM at night for cough prior to sleep due to sedative effects.  Advised patient not to use cough medications together.  Encouraged increase daily water intake to 64 ounces per day while taking these medications.  Advised patient to self quarantine for the next 5 days.  Final Clinical Impressions(s) / UC Diagnoses   Final diagnoses:  Cough, unspecified type  Exposure to COVID-19 virus  COVID-19     Discharge Instructions      Instructed patient to take medications as directed with food to completion.  Advised patient to take prednisone with first dose of Paxlovid for the next 5 days.  Advised may use Tessalon daily or as needed for cough.  Advised may use Promethazine DM at night for cough prior to sleep due to sedative effects.  Advised patient not to use cough medications together.  Encouraged increase daily water intake to 64 ounces per day while taking these medications.  Advised patient to self quarantine for the next 5 days.     ED Prescriptions     Medication Sig Dispense Auth. Provider   nirmatrelvir & ritonavir (PAXLOVID, 300/100,) 20 x 150 MG & 10 x 100MG  TBPK Take 3 tablets by mouth 2 (two) times daily for 5 days. 30 tablet Trevor Iha, FNP   predniSONE (DELTASONE) 20 MG tablet Take 3 tabs PO daily x 5 days. 15 tablet Trevor Iha, FNP   benzonatate (TESSALON) 200 MG capsule Take 1 capsule (200 mg total) by mouth 3 (three) times daily as needed for up to 7 days. 40 capsule Trevor Iha, FNP   promethazine-dextromethorphan (PROMETHAZINE-DM) 6.25-15 MG/5ML syrup Take 5 mLs by mouth 2 (two) times daily as needed for cough. 118 mL Trevor Iha, FNP      PDMP not reviewed this encounter.   Briley, Towers, FNP 12/09/22 1013

## 2022-12-09 NOTE — Discharge Instructions (Addendum)
Instructed patient to take medications as directed with food to completion.  Advised patient to take prednisone with first dose of Paxlovid for the next 5 days.  Advised may use Tessalon daily or as needed for cough.  Advised may use Promethazine DM at night for cough prior to sleep due to sedative effects.  Advised patient not to use cough medications together.  Encouraged increase daily water intake to 64 ounces per day while taking these medications.  Advised patient to self quarantine for the next 5 days.

## 2022-12-09 NOTE — Telephone Encounter (Signed)
[facility] pharmacy did not have Paxlovid in stock, Paxlovid has been now sent to [facility] [place], [place].

## 2022-12-09 NOTE — ED Triage Notes (Signed)
Pt c/o cough, nasal congestion, sinus pressure (facial pain) and headache x 2 days. Denies fever. Took OTC cold and flu meds prn. Recent covid exposure at work.

## 2023-05-09 ENCOUNTER — Ambulatory Visit (INDEPENDENT_AMBULATORY_CARE_PROVIDER_SITE_OTHER): Payer: No Typology Code available for payment source | Admitting: Sports Medicine

## 2023-05-09 ENCOUNTER — Encounter: Payer: Self-pay | Admitting: Sports Medicine

## 2023-05-09 VITALS — BP 115/78 | HR 63 | Ht 70.0 in | Wt 192.0 lb

## 2023-05-09 DIAGNOSIS — Z0000 Encounter for general adult medical examination without abnormal findings: Secondary | ICD-10-CM | POA: Diagnosis not present

## 2023-05-09 DIAGNOSIS — I10 Essential (primary) hypertension: Secondary | ICD-10-CM

## 2023-05-09 DIAGNOSIS — L918 Other hypertrophic disorders of the skin: Secondary | ICD-10-CM

## 2023-05-09 DIAGNOSIS — N139 Obstructive and reflux uropathy, unspecified: Secondary | ICD-10-CM | POA: Diagnosis not present

## 2023-05-09 DIAGNOSIS — E291 Testicular hypofunction: Secondary | ICD-10-CM

## 2023-05-09 NOTE — Assessment & Plan Note (Signed)
Cryotherapy of multiple skin tags, left groin, left buttock, several in the left axilla. He understands he may need to return in a month or so for retreatment.

## 2023-05-09 NOTE — Progress Notes (Signed)
Subjective:    CC: Annual Physical Exam  HPI:  This patient is here for their annual physical  I reviewed the past medical history, family history, social history, surgical history, and allergies today and no changes were needed.  Please see the problem list section below in epic for further details.  Past Medical History: No past medical history on file. Past Surgical History: No past surgical history on file. Social History: Social History   Socioeconomic History   Marital status: Married    Spouse name: Not on file   Number of children: Not on file   Years of education: Not on file   Highest education level: Not on file  Occupational History   Not on file  Tobacco Use   Smoking status: Every Day    Current packs/day: 0.00    Types: Cigarettes    Last attempt to quit: 09/25/2003    Years since quitting: 19.6   Smokeless tobacco: Never  Substance and Sexual Activity   Alcohol use: No   Drug use: No   Sexual activity: Not on file  Other Topics Concern   Not on file  Social History Narrative   Not on file   Social Determinants of Health   Financial Resource Strain: Not on file  Food Insecurity: Not on file  Transportation Needs: Not on file  Physical Activity: Not on file  Stress: Not on file  Social Connections: Not on file   Family History: Family History  Problem Relation Age of Onset   Lupus Father    Hypertension Father    Hypertension Brother    Allergies: Allergies  Allergen Reactions   Pollen Extract    Medications: See med rec.  Review of Systems: No headache, visual changes, nausea, vomiting, diarrhea, constipation, dizziness, abdominal pain, skin rash, fevers, chills, night sweats, swollen lymph nodes, weight loss, chest pain, body aches, joint swelling, muscle aches, shortness of breath, mood changes, visual or auditory hallucinations.  Objective:    General: Well Developed, well nourished, and in no acute distress.  Neuro: Alert and  oriented x3, extra-ocular muscles intact, sensation grossly intact. Cranial nerves II through XII are intact, motor, sensory, and coordinative functions are all intact. HEENT: Normocephalic, atraumatic, pupils equal round reactive to light, neck supple, no masses, no lymphadenopathy, thyroid nonpalpable. Oropharynx, nasopharynx, external ear canals are unremarkable. Skin: Warm and dry, no rashes noted.  Scattered skin tags left groin, left buttock, left axilla.  There are a few benign-appearing seborrheic keratoses and small scattered hemangiomas. Cardiac: Regular rate and rhythm, no murmurs rubs or gallops.  Respiratory: Clear to auscultation bilaterally. Not using accessory muscles, speaking in full sentences.  Abdominal: Soft, nontender, nondistended, positive bowel sounds, no masses, no organomegaly.  Musculoskeletal: Shoulder, elbow, wrist, hip, knee, ankle stable, and with full range of motion.  Procedure:  Cryodestruction of approximately 8 skin tags across the left groin, left buttock and left axilla Consent obtained and verified. Time-out conducted. Noted no overlying erythema, induration, or other signs of local infection. Completed without difficulty using Cryo-Gun. Advised to call if fevers/chills, erythema, induration, drainage, or persistent bleeding.  Impression and Recommendations:    The patient was counselled, risk factors were discussed, anticipatory guidance given.  Annual physical exam Fasting annual physical as above, declines flu, will think about Shingrix. Cologuard was done in April 2022, he will be due in April 2025.  Acrochordon Cryotherapy of multiple skin tags, left groin, left buttock, several in the left axilla. He understands he may  need to return in a month or so for retreatment.   ____________________________________________ Ihor Austin. Benjamin Stain, M.D., ABFM., CAQSM., AME. Primary Care and Sports Medicine [facility]  Adjunct  Professor of Family Medicine  [employer]  Restaurant manager, [employer]

## 2023-05-09 NOTE — Assessment & Plan Note (Addendum)
Fasting annual physical as above, declines flu, will think about Shingrix. Cologuard was done in April 2022, he will be due in April 2025.

## 2023-05-13 LAB — COMPREHENSIVE METABOLIC PANEL
ALT: 44 [IU]/L (ref 0–44)
AST: 26 [IU]/L (ref 0–40)
Albumin: 4.7 g/dL (ref 4.1–5.1)
Alkaline Phosphatase: 80 [IU]/L (ref 44–121)
BUN/Creatinine Ratio: 11 (ref 9–20)
BUN: 14 mg/dL (ref 6–24)
Bilirubin Total: 0.7 mg/dL (ref 0.0–1.2)
CO2: 22 mmol/L (ref 20–29)
Calcium: 9.9 mg/dL (ref 8.7–10.2)
Chloride: 104 mmol/L (ref 96–106)
Creatinine, Ser: 1.27 mg/dL (ref 0.76–1.27)
Globulin, Total: 2.4 g/dL (ref 1.5–4.5)
Glucose: 102 mg/dL — ABNORMAL HIGH (ref 70–99)
Potassium: 4.5 mmol/L (ref 3.5–5.2)
Sodium: 140 mmol/L (ref 134–144)
Total Protein: 7.1 g/dL (ref 6.0–8.5)
eGFR: 69 mL/min/{1.73_m2} (ref >59)

## 2023-05-13 LAB — CBC
Hematocrit: 46.1 % (ref 37.5–51.0)
Hemoglobin: 15.5 g/dL (ref 13.0–17.7)
MCH: 30.9 pg (ref 26.6–33.0)
MCHC: 33.6 g/dL (ref 31.5–35.7)
MCV: 92 fL (ref 79–97)
Platelets: 268 10*3/uL (ref 150–450)
RBC: 5.01 x10E6/uL (ref 4.14–5.80)
RDW: 12.2 % (ref 11.6–15.4)
WBC: 6.4 10*3/uL (ref 3.4–10.8)

## 2023-05-13 LAB — TESTOSTERONE, FREE, TOTAL, SHBG
Sex Hormone Binding: 25.3 nmol/L (ref 19.3–76.4)
Testosterone, Free: 8.3 pg/mL (ref 7.2–24.0)
Testosterone: 330 ng/dL (ref 264–916)

## 2023-05-13 LAB — LIPID PANEL
Chol/HDL Ratio: 3.6 ratio (ref 0.0–5.0)
Cholesterol, Total: 139 mg/dL (ref 100–199)
HDL: 39 mg/dL — ABNORMAL LOW (ref >39)
LDL Chol Calc (NIH): 81 mg/dL (ref 0–99)
Triglycerides: 104 mg/dL (ref 0–149)
VLDL Cholesterol Cal: 19 mg/dL (ref 5–40)

## 2023-05-13 LAB — PSA, TOTAL AND FREE
PSA, Free Pct: 46 %
PSA, Free: 0.23 ng/mL
Prostate Specific Ag, Serum: 0.5 ng/mL (ref 0.0–4.0)

## 2023-05-13 LAB — HEMOGLOBIN A1C
Est. average glucose Bld gHb Est-mCnc: 120 mg/dL
Hgb A1c MFr Bld: 5.8 % — ABNORMAL HIGH (ref 4.8–5.6)

## 2023-05-13 LAB — TSH: TSH: 2.06 u[IU]/mL (ref 0.450–4.500)

## 2023-09-30 ENCOUNTER — Other Ambulatory Visit: Payer: Self-pay | Admitting: Sports Medicine

## 2023-09-30 DIAGNOSIS — I10 Essential (primary) hypertension: Secondary | ICD-10-CM

## 2024-03-20 ENCOUNTER — Encounter: Payer: Self-pay | Admitting: Sports Medicine

## 2024-05-03 ENCOUNTER — Other Ambulatory Visit: Payer: Self-pay

## 2024-05-03 ENCOUNTER — Encounter: Payer: Self-pay | Admitting: Medical-Surgical

## 2024-05-03 DIAGNOSIS — Z1211 Encounter for screening for malignant neoplasm of colon: Secondary | ICD-10-CM
